# Patient Record
Sex: Female | Born: 1955
Health system: Southern US, Community
[De-identification: ages and names within clinical notes are randomized; demographics above are authoritative.]

---

## 2011-06-28 DIAGNOSIS — J309 Allergic rhinitis, unspecified: Secondary | ICD-10-CM | POA: Diagnosis not present

## 2011-06-28 DIAGNOSIS — K219 Gastro-esophageal reflux disease without esophagitis: Secondary | ICD-10-CM | POA: Diagnosis not present

## 2011-09-19 DIAGNOSIS — J309 Allergic rhinitis, unspecified: Secondary | ICD-10-CM | POA: Diagnosis not present

## 2011-09-19 DIAGNOSIS — E7800 Pure hypercholesterolemia, unspecified: Secondary | ICD-10-CM | POA: Diagnosis not present

## 2011-09-19 DIAGNOSIS — Z79899 Other long term (current) drug therapy: Secondary | ICD-10-CM | POA: Diagnosis not present

## 2011-09-19 DIAGNOSIS — I10 Essential (primary) hypertension: Secondary | ICD-10-CM | POA: Diagnosis not present

## 2011-09-27 DIAGNOSIS — E7800 Pure hypercholesterolemia, unspecified: Secondary | ICD-10-CM | POA: Diagnosis not present

## 2011-09-27 DIAGNOSIS — K219 Gastro-esophageal reflux disease without esophagitis: Secondary | ICD-10-CM | POA: Diagnosis not present

## 2011-09-27 DIAGNOSIS — J309 Allergic rhinitis, unspecified: Secondary | ICD-10-CM | POA: Diagnosis not present

## 2012-01-23 DIAGNOSIS — Z1231 Encounter for screening mammogram for malignant neoplasm of breast: Secondary | ICD-10-CM | POA: Diagnosis not present

## 2012-03-13 DIAGNOSIS — Z23 Encounter for immunization: Secondary | ICD-10-CM | POA: Diagnosis not present

## 2012-03-13 DIAGNOSIS — K219 Gastro-esophageal reflux disease without esophagitis: Secondary | ICD-10-CM | POA: Diagnosis not present

## 2012-03-13 DIAGNOSIS — M722 Plantar fascial fibromatosis: Secondary | ICD-10-CM | POA: Diagnosis not present

## 2012-04-16 DIAGNOSIS — E7800 Pure hypercholesterolemia, unspecified: Secondary | ICD-10-CM | POA: Diagnosis not present

## 2012-04-16 DIAGNOSIS — R0602 Shortness of breath: Secondary | ICD-10-CM | POA: Diagnosis not present

## 2012-04-16 DIAGNOSIS — Z0000 Encounter for general adult medical examination without abnormal findings: Secondary | ICD-10-CM | POA: Diagnosis not present

## 2012-05-07 DIAGNOSIS — Z79899 Other long term (current) drug therapy: Secondary | ICD-10-CM | POA: Diagnosis not present

## 2012-05-07 DIAGNOSIS — Z780 Asymptomatic menopausal state: Secondary | ICD-10-CM | POA: Diagnosis not present

## 2012-05-07 DIAGNOSIS — E7800 Pure hypercholesterolemia, unspecified: Secondary | ICD-10-CM | POA: Diagnosis not present

## 2012-11-10 DIAGNOSIS — J309 Allergic rhinitis, unspecified: Secondary | ICD-10-CM | POA: Diagnosis not present

## 2012-11-10 DIAGNOSIS — G5620 Lesion of ulnar nerve, unspecified upper limb: Secondary | ICD-10-CM | POA: Diagnosis not present

## 2012-11-10 DIAGNOSIS — G5600 Carpal tunnel syndrome, unspecified upper limb: Secondary | ICD-10-CM | POA: Diagnosis not present

## 2012-11-10 DIAGNOSIS — K219 Gastro-esophageal reflux disease without esophagitis: Secondary | ICD-10-CM | POA: Diagnosis not present

## 2013-06-17 ENCOUNTER — Other Ambulatory Visit: Payer: Self-pay | Admitting: Internal Medicine

## 2013-06-17 DIAGNOSIS — K219 Gastro-esophageal reflux disease without esophagitis: Secondary | ICD-10-CM | POA: Diagnosis not present

## 2013-06-17 DIAGNOSIS — G589 Mononeuropathy, unspecified: Secondary | ICD-10-CM | POA: Diagnosis not present

## 2013-06-17 DIAGNOSIS — Z136 Encounter for screening for cardiovascular disorders: Secondary | ICD-10-CM | POA: Diagnosis not present

## 2013-06-17 DIAGNOSIS — Z1231 Encounter for screening mammogram for malignant neoplasm of breast: Secondary | ICD-10-CM

## 2013-06-17 DIAGNOSIS — Z79899 Other long term (current) drug therapy: Secondary | ICD-10-CM | POA: Diagnosis not present

## 2013-06-17 DIAGNOSIS — Z1331 Encounter for screening for depression: Secondary | ICD-10-CM | POA: Diagnosis not present

## 2013-06-17 DIAGNOSIS — Z0000 Encounter for general adult medical examination without abnormal findings: Secondary | ICD-10-CM | POA: Diagnosis not present

## 2013-06-17 DIAGNOSIS — Z23 Encounter for immunization: Secondary | ICD-10-CM | POA: Diagnosis not present

## 2013-07-01 ENCOUNTER — Ambulatory Visit
Admission: RE | Admit: 2013-07-01 | Discharge: 2013-07-01 | Disposition: A | Discharge status: Home / Self Care | Payer: Medicare Other | Source: Ambulatory Visit | Attending: Internal Medicine | Admitting: Internal Medicine

## 2013-07-01 DIAGNOSIS — Z1231 Encounter for screening mammogram for malignant neoplasm of breast: Secondary | ICD-10-CM

## 2013-07-08 ENCOUNTER — Other Ambulatory Visit: Payer: Self-pay | Admitting: Internal Medicine

## 2013-07-08 DIAGNOSIS — R928 Other abnormal and inconclusive findings on diagnostic imaging of breast: Secondary | ICD-10-CM

## 2013-07-31 DIAGNOSIS — N6459 Other signs and symptoms in breast: Secondary | ICD-10-CM | POA: Diagnosis not present

## 2013-08-14 DIAGNOSIS — R922 Inconclusive mammogram: Secondary | ICD-10-CM | POA: Diagnosis not present

## 2013-08-14 DIAGNOSIS — N6009 Solitary cyst of unspecified breast: Secondary | ICD-10-CM | POA: Diagnosis not present

## 2013-08-29 DIAGNOSIS — N760 Acute vaginitis: Secondary | ICD-10-CM | POA: Diagnosis not present

## 2013-08-29 DIAGNOSIS — N94819 Vulvodynia, unspecified: Secondary | ICD-10-CM | POA: Diagnosis not present

## 2014-03-07 DIAGNOSIS — Z23 Encounter for immunization: Secondary | ICD-10-CM | POA: Diagnosis not present

## 2014-06-21 DIAGNOSIS — Z0000 Encounter for general adult medical examination without abnormal findings: Secondary | ICD-10-CM | POA: Diagnosis not present

## 2014-06-21 DIAGNOSIS — K219 Gastro-esophageal reflux disease without esophagitis: Secondary | ICD-10-CM | POA: Diagnosis not present

## 2014-06-21 DIAGNOSIS — Z1389 Encounter for screening for other disorder: Secondary | ICD-10-CM | POA: Diagnosis not present

## 2014-06-30 DIAGNOSIS — Z23 Encounter for immunization: Secondary | ICD-10-CM | POA: Diagnosis not present

## 2014-07-27 ENCOUNTER — Other Ambulatory Visit: Payer: Self-pay | Admitting: Obstetrics and Gynecology

## 2014-07-27 ENCOUNTER — Other Ambulatory Visit (HOSPITAL_COMMUNITY)
Admission: RE | Admit: 2014-07-27 | Discharge: 2014-07-27 | Disposition: A | Discharge status: Home / Self Care | Payer: Medicare Other | Source: Ambulatory Visit | Attending: Obstetrics and Gynecology | Admitting: Obstetrics and Gynecology

## 2014-07-27 DIAGNOSIS — Z1151 Encounter for screening for human papillomavirus (HPV): Secondary | ICD-10-CM | POA: Insufficient documentation

## 2014-07-27 DIAGNOSIS — Z01419 Encounter for gynecological examination (general) (routine) without abnormal findings: Secondary | ICD-10-CM | POA: Diagnosis not present

## 2014-07-27 DIAGNOSIS — Z124 Encounter for screening for malignant neoplasm of cervix: Secondary | ICD-10-CM | POA: Insufficient documentation

## 2014-07-28 LAB — CYTOLOGY - PAP

## 2014-08-09 DIAGNOSIS — Z1231 Encounter for screening mammogram for malignant neoplasm of breast: Secondary | ICD-10-CM | POA: Diagnosis not present

## 2014-08-10 DIAGNOSIS — R197 Diarrhea, unspecified: Secondary | ICD-10-CM | POA: Diagnosis not present

## 2014-08-10 DIAGNOSIS — D649 Anemia, unspecified: Secondary | ICD-10-CM | POA: Diagnosis not present

## 2014-08-10 DIAGNOSIS — R1013 Epigastric pain: Secondary | ICD-10-CM | POA: Diagnosis not present

## 2015-06-21 DIAGNOSIS — K219 Gastro-esophageal reflux disease without esophagitis: Secondary | ICD-10-CM | POA: Diagnosis not present

## 2015-06-21 DIAGNOSIS — R21 Rash and other nonspecific skin eruption: Secondary | ICD-10-CM | POA: Diagnosis not present

## 2015-08-15 DIAGNOSIS — Z1231 Encounter for screening mammogram for malignant neoplasm of breast: Secondary | ICD-10-CM | POA: Diagnosis not present

## 2015-08-22 DIAGNOSIS — Z7251 High risk heterosexual behavior: Secondary | ICD-10-CM | POA: Diagnosis not present

## 2015-08-23 DIAGNOSIS — Z136 Encounter for screening for cardiovascular disorders: Secondary | ICD-10-CM | POA: Diagnosis not present

## 2015-08-23 DIAGNOSIS — Z131 Encounter for screening for diabetes mellitus: Secondary | ICD-10-CM | POA: Diagnosis not present

## 2016-07-27 DIAGNOSIS — K219 Gastro-esophageal reflux disease without esophagitis: Secondary | ICD-10-CM | POA: Diagnosis not present

## 2016-07-27 DIAGNOSIS — Z1389 Encounter for screening for other disorder: Secondary | ICD-10-CM | POA: Diagnosis not present

## 2016-07-27 DIAGNOSIS — Z0000 Encounter for general adult medical examination without abnormal findings: Secondary | ICD-10-CM | POA: Diagnosis not present

## 2016-07-27 DIAGNOSIS — Z131 Encounter for screening for diabetes mellitus: Secondary | ICD-10-CM | POA: Diagnosis not present

## 2016-07-27 DIAGNOSIS — Z79899 Other long term (current) drug therapy: Secondary | ICD-10-CM | POA: Diagnosis not present

## 2016-07-27 DIAGNOSIS — J3089 Other allergic rhinitis: Secondary | ICD-10-CM | POA: Diagnosis not present

## 2016-07-27 DIAGNOSIS — J4520 Mild intermittent asthma, uncomplicated: Secondary | ICD-10-CM | POA: Diagnosis not present

## 2016-07-27 DIAGNOSIS — Z5181 Encounter for therapeutic drug level monitoring: Secondary | ICD-10-CM | POA: Diagnosis not present

## 2016-08-14 DIAGNOSIS — Z1231 Encounter for screening mammogram for malignant neoplasm of breast: Secondary | ICD-10-CM | POA: Diagnosis not present

## 2016-11-30 ENCOUNTER — Other Ambulatory Visit: Payer: Self-pay | Admitting: Internal Medicine

## 2016-11-30 ENCOUNTER — Ambulatory Visit
Admission: RE | Admit: 2016-11-30 | Discharge: 2016-11-30 | Disposition: A | Discharge status: Home / Self Care | Payer: Medicare Other | Source: Ambulatory Visit | Attending: Internal Medicine | Admitting: Internal Medicine

## 2016-11-30 DIAGNOSIS — M79671 Pain in right foot: Secondary | ICD-10-CM

## 2016-11-30 DIAGNOSIS — J4520 Mild intermittent asthma, uncomplicated: Secondary | ICD-10-CM | POA: Diagnosis not present

## 2016-11-30 DIAGNOSIS — M19071 Primary osteoarthritis, right ankle and foot: Secondary | ICD-10-CM | POA: Diagnosis not present

## 2017-07-29 DIAGNOSIS — J3089 Other allergic rhinitis: Secondary | ICD-10-CM | POA: Diagnosis not present

## 2017-07-29 DIAGNOSIS — Z136 Encounter for screening for cardiovascular disorders: Secondary | ICD-10-CM | POA: Diagnosis not present

## 2017-07-29 DIAGNOSIS — Z0000 Encounter for general adult medical examination without abnormal findings: Secondary | ICD-10-CM | POA: Diagnosis not present

## 2017-07-29 DIAGNOSIS — K219 Gastro-esophageal reflux disease without esophagitis: Secondary | ICD-10-CM | POA: Diagnosis not present

## 2017-07-29 DIAGNOSIS — Z1389 Encounter for screening for other disorder: Secondary | ICD-10-CM | POA: Diagnosis not present

## 2017-07-29 DIAGNOSIS — J4520 Mild intermittent asthma, uncomplicated: Secondary | ICD-10-CM | POA: Diagnosis not present

## 2017-08-13 DIAGNOSIS — Z1211 Encounter for screening for malignant neoplasm of colon: Secondary | ICD-10-CM | POA: Diagnosis not present

## 2017-08-13 DIAGNOSIS — K219 Gastro-esophageal reflux disease without esophagitis: Secondary | ICD-10-CM | POA: Diagnosis not present

## 2017-08-15 DIAGNOSIS — Z1231 Encounter for screening mammogram for malignant neoplasm of breast: Secondary | ICD-10-CM | POA: Diagnosis not present

## 2017-08-29 ENCOUNTER — Other Ambulatory Visit (HOSPITAL_COMMUNITY)
Admission: RE | Admit: 2017-08-29 | Discharge: 2017-08-29 | Disposition: A | Discharge status: Home / Self Care | Payer: Medicare Other | Source: Ambulatory Visit | Attending: Nurse Practitioner | Admitting: Nurse Practitioner

## 2017-08-29 ENCOUNTER — Other Ambulatory Visit: Payer: Self-pay | Admitting: Nurse Practitioner

## 2017-08-29 DIAGNOSIS — Z01419 Encounter for gynecological examination (general) (routine) without abnormal findings: Secondary | ICD-10-CM | POA: Diagnosis not present

## 2017-08-29 DIAGNOSIS — R3915 Urgency of urination: Secondary | ICD-10-CM | POA: Diagnosis not present

## 2017-09-04 LAB — CYTOLOGY - PAP
Diagnosis: NEGATIVE
HPV (WINDOPATH): NOT DETECTED

## 2017-09-13 DIAGNOSIS — K3189 Other diseases of stomach and duodenum: Secondary | ICD-10-CM | POA: Diagnosis not present

## 2017-09-13 DIAGNOSIS — Z1211 Encounter for screening for malignant neoplasm of colon: Secondary | ICD-10-CM | POA: Diagnosis not present

## 2017-09-13 DIAGNOSIS — K228 Other specified diseases of esophagus: Secondary | ICD-10-CM | POA: Diagnosis not present

## 2017-09-13 DIAGNOSIS — K625 Hemorrhage of anus and rectum: Secondary | ICD-10-CM | POA: Diagnosis not present

## 2017-09-13 DIAGNOSIS — K635 Polyp of colon: Secondary | ICD-10-CM | POA: Diagnosis not present

## 2017-09-13 DIAGNOSIS — K317 Polyp of stomach and duodenum: Secondary | ICD-10-CM | POA: Diagnosis not present

## 2017-09-13 DIAGNOSIS — K2930 Chronic superficial gastritis without bleeding: Secondary | ICD-10-CM | POA: Diagnosis not present

## 2017-09-18 DIAGNOSIS — K317 Polyp of stomach and duodenum: Secondary | ICD-10-CM | POA: Diagnosis not present

## 2017-09-18 DIAGNOSIS — K3189 Other diseases of stomach and duodenum: Secondary | ICD-10-CM | POA: Diagnosis not present

## 2017-09-18 DIAGNOSIS — K635 Polyp of colon: Secondary | ICD-10-CM | POA: Diagnosis not present

## 2017-09-18 DIAGNOSIS — Z1211 Encounter for screening for malignant neoplasm of colon: Secondary | ICD-10-CM | POA: Diagnosis not present

## 2018-07-31 DIAGNOSIS — R030 Elevated blood-pressure reading, without diagnosis of hypertension: Secondary | ICD-10-CM | POA: Diagnosis not present

## 2018-07-31 DIAGNOSIS — M19071 Primary osteoarthritis, right ankle and foot: Secondary | ICD-10-CM | POA: Diagnosis not present

## 2018-07-31 DIAGNOSIS — Z0000 Encounter for general adult medical examination without abnormal findings: Secondary | ICD-10-CM | POA: Diagnosis not present

## 2018-07-31 DIAGNOSIS — K219 Gastro-esophageal reflux disease without esophagitis: Secondary | ICD-10-CM | POA: Diagnosis not present

## 2018-07-31 DIAGNOSIS — Z1389 Encounter for screening for other disorder: Secondary | ICD-10-CM | POA: Diagnosis not present

## 2018-12-29 DIAGNOSIS — Z1231 Encounter for screening mammogram for malignant neoplasm of breast: Secondary | ICD-10-CM | POA: Diagnosis not present

## 2018-12-29 DIAGNOSIS — Z803 Family history of malignant neoplasm of breast: Secondary | ICD-10-CM | POA: Diagnosis not present

## 2019-01-07 DIAGNOSIS — Z23 Encounter for immunization: Secondary | ICD-10-CM | POA: Diagnosis not present

## 2019-05-01 ENCOUNTER — Ambulatory Visit: Payer: Medicare Other

## 2019-08-04 DIAGNOSIS — M25552 Pain in left hip: Secondary | ICD-10-CM | POA: Diagnosis not present

## 2019-08-04 DIAGNOSIS — Z1389 Encounter for screening for other disorder: Secondary | ICD-10-CM | POA: Diagnosis not present

## 2019-08-04 DIAGNOSIS — Z23 Encounter for immunization: Secondary | ICD-10-CM | POA: Diagnosis not present

## 2019-08-04 DIAGNOSIS — Z0000 Encounter for general adult medical examination without abnormal findings: Secondary | ICD-10-CM | POA: Diagnosis not present

## 2019-08-04 DIAGNOSIS — K219 Gastro-esophageal reflux disease without esophagitis: Secondary | ICD-10-CM | POA: Diagnosis not present

## 2019-08-04 DIAGNOSIS — I10 Essential (primary) hypertension: Secondary | ICD-10-CM | POA: Diagnosis not present

## 2019-09-18 DIAGNOSIS — I10 Essential (primary) hypertension: Secondary | ICD-10-CM | POA: Diagnosis not present

## 2019-10-30 DIAGNOSIS — I10 Essential (primary) hypertension: Secondary | ICD-10-CM | POA: Diagnosis not present

## 2020-01-04 DIAGNOSIS — Z1231 Encounter for screening mammogram for malignant neoplasm of breast: Secondary | ICD-10-CM | POA: Diagnosis not present

## 2020-01-04 DIAGNOSIS — Z13820 Encounter for screening for osteoporosis: Secondary | ICD-10-CM | POA: Diagnosis not present

## 2020-01-04 DIAGNOSIS — Z01419 Encounter for gynecological examination (general) (routine) without abnormal findings: Secondary | ICD-10-CM | POA: Diagnosis not present

## 2020-01-04 DIAGNOSIS — Z87898 Personal history of other specified conditions: Secondary | ICD-10-CM | POA: Diagnosis not present

## 2020-01-08 DIAGNOSIS — Z23 Encounter for immunization: Secondary | ICD-10-CM | POA: Diagnosis not present

## 2020-01-13 DIAGNOSIS — Z23 Encounter for immunization: Secondary | ICD-10-CM | POA: Diagnosis not present

## 2020-01-20 DIAGNOSIS — Z87898 Personal history of other specified conditions: Secondary | ICD-10-CM | POA: Diagnosis not present

## 2020-01-26 ENCOUNTER — Other Ambulatory Visit: Payer: Self-pay | Admitting: Nurse Practitioner

## 2020-01-26 DIAGNOSIS — Z13820 Encounter for screening for osteoporosis: Secondary | ICD-10-CM

## 2020-01-29 ENCOUNTER — Ambulatory Visit
Admission: RE | Admit: 2020-01-29 | Discharge: 2020-01-29 | Disposition: A | Discharge status: Home / Self Care | Payer: Medicare Other | Source: Ambulatory Visit | Attending: Nurse Practitioner | Admitting: Nurse Practitioner

## 2020-01-29 ENCOUNTER — Other Ambulatory Visit: Payer: Self-pay

## 2020-01-29 DIAGNOSIS — Z13820 Encounter for screening for osteoporosis: Secondary | ICD-10-CM

## 2020-01-29 DIAGNOSIS — Z780 Asymptomatic menopausal state: Secondary | ICD-10-CM | POA: Diagnosis not present

## 2020-02-03 DIAGNOSIS — R3121 Asymptomatic microscopic hematuria: Secondary | ICD-10-CM | POA: Diagnosis not present

## 2020-02-03 DIAGNOSIS — Z87898 Personal history of other specified conditions: Secondary | ICD-10-CM | POA: Diagnosis not present

## 2020-07-14 DIAGNOSIS — Z23 Encounter for immunization: Secondary | ICD-10-CM | POA: Diagnosis not present

## 2020-08-22 DIAGNOSIS — K219 Gastro-esophageal reflux disease without esophagitis: Secondary | ICD-10-CM | POA: Diagnosis not present

## 2020-08-22 DIAGNOSIS — Z0000 Encounter for general adult medical examination without abnormal findings: Secondary | ICD-10-CM | POA: Diagnosis not present

## 2020-08-22 DIAGNOSIS — I10 Essential (primary) hypertension: Secondary | ICD-10-CM | POA: Diagnosis not present

## 2020-08-22 DIAGNOSIS — Z1389 Encounter for screening for other disorder: Secondary | ICD-10-CM | POA: Diagnosis not present

## 2020-09-20 DIAGNOSIS — H43813 Vitreous degeneration, bilateral: Secondary | ICD-10-CM | POA: Diagnosis not present

## 2020-09-20 DIAGNOSIS — H52223 Regular astigmatism, bilateral: Secondary | ICD-10-CM | POA: Diagnosis not present

## 2020-09-20 DIAGNOSIS — H524 Presbyopia: Secondary | ICD-10-CM | POA: Diagnosis not present

## 2020-09-20 DIAGNOSIS — H5213 Myopia, bilateral: Secondary | ICD-10-CM | POA: Diagnosis not present

## 2020-09-20 DIAGNOSIS — H2513 Age-related nuclear cataract, bilateral: Secondary | ICD-10-CM | POA: Diagnosis not present

## 2020-09-20 DIAGNOSIS — H16223 Keratoconjunctivitis sicca, not specified as Sjogren's, bilateral: Secondary | ICD-10-CM | POA: Diagnosis not present

## 2021-01-17 DIAGNOSIS — Z803 Family history of malignant neoplasm of breast: Secondary | ICD-10-CM | POA: Diagnosis not present

## 2021-01-17 DIAGNOSIS — Z23 Encounter for immunization: Secondary | ICD-10-CM | POA: Diagnosis not present

## 2021-01-17 DIAGNOSIS — Z9189 Other specified personal risk factors, not elsewhere classified: Secondary | ICD-10-CM | POA: Diagnosis not present

## 2021-01-26 DIAGNOSIS — Z1231 Encounter for screening mammogram for malignant neoplasm of breast: Secondary | ICD-10-CM | POA: Diagnosis not present

## 2021-03-31 DIAGNOSIS — U071 COVID-19: Secondary | ICD-10-CM | POA: Diagnosis not present

## 2021-08-29 ENCOUNTER — Other Ambulatory Visit: Payer: Self-pay | Admitting: Internal Medicine

## 2021-08-29 ENCOUNTER — Other Ambulatory Visit: Payer: Self-pay

## 2021-08-29 DIAGNOSIS — H539 Unspecified visual disturbance: Secondary | ICD-10-CM | POA: Diagnosis not present

## 2021-08-29 DIAGNOSIS — I10 Essential (primary) hypertension: Secondary | ICD-10-CM | POA: Diagnosis not present

## 2021-08-29 DIAGNOSIS — D649 Anemia, unspecified: Secondary | ICD-10-CM | POA: Diagnosis not present

## 2021-08-29 DIAGNOSIS — Z13220 Encounter for screening for lipoid disorders: Secondary | ICD-10-CM | POA: Diagnosis not present

## 2021-08-29 DIAGNOSIS — Z5181 Encounter for therapeutic drug level monitoring: Secondary | ICD-10-CM | POA: Diagnosis not present

## 2021-08-29 DIAGNOSIS — Z0000 Encounter for general adult medical examination without abnormal findings: Secondary | ICD-10-CM | POA: Diagnosis not present

## 2021-08-29 DIAGNOSIS — Z1331 Encounter for screening for depression: Secondary | ICD-10-CM | POA: Diagnosis not present

## 2021-08-29 DIAGNOSIS — Z23 Encounter for immunization: Secondary | ICD-10-CM | POA: Diagnosis not present

## 2021-08-29 DIAGNOSIS — K219 Gastro-esophageal reflux disease without esophagitis: Secondary | ICD-10-CM | POA: Diagnosis not present

## 2021-08-29 DIAGNOSIS — M79605 Pain in left leg: Secondary | ICD-10-CM | POA: Diagnosis not present

## 2021-09-08 ENCOUNTER — Ambulatory Visit
Admission: RE | Admit: 2021-09-08 | Discharge: 2021-09-08 | Disposition: A | Discharge status: Home / Self Care | Payer: Medicare Other | Source: Ambulatory Visit | Attending: Internal Medicine | Admitting: Internal Medicine

## 2021-09-08 DIAGNOSIS — I10 Essential (primary) hypertension: Secondary | ICD-10-CM | POA: Diagnosis not present

## 2021-09-12 NOTE — Therapy (Signed)
OUTPATIENT PHYSICAL THERAPY LOWER EXTREMITY EVALUATION   Patient Name: Rose Burke MRN: [id number] DOB:1955-04-15, 66 y.o., female Today's Date: 09/14/2021   PT End of Session - 09/14/21 0549     Visit Number 1    Number of Visits 13    Date for PT Re-Evaluation 11/03/21    Authorization Type MEDICARE PART A AND B; BCBS SUPPLEMENT    PT Start Time 0933    PT Stop Time 1019    PT Time Calculation (min) 46 min    Activity Tolerance Patient tolerated treatment well    Behavior During Therapy [facility] for tasks assessed/performed             History reviewed. No pertinent past medical history. History reviewed. No pertinent surgical history. There are no problems to display for this patient.   PCP: Kirby Funk, MD  REFERRING PROVIDER: Kirby Funk, MD  REFERRING DIAG: [phone] (ICD-10-CM) - Pain in left leg  THERAPY DIAG:  Pain in left leg  Muscle weakness (generalized)  Abnormal posture  Rationale for Evaluation and Treatment Rehabilitation  ONSET DATE:4-5 months  SUBJECTIVE:   SUBJECTIVE STATEMENT: Pt reports a probable MOI being a  fall in the snow in 2015. Pain in her legs have gradually become more of an issue. Pt reports a specific radiating L lateral leg pain to the lateral foot with an electrical feeling starting arounf 4-32months ago. She notes it was occurring at a frequency of 2x/week, but it has not occurred recently for approx. 2 weeks. Pt denies pain associated with back movements. Has arthritic pain of the knees  PERTINENT HISTORY: High BMI  PAIN:  Are you having pain? Yes: NPRS scale: 7/10 Pain location: L lalteral leg extending to the lateral foot Pain description: ache, sharp, throb, electric Aggravating factors: Cumulative activity, not a specific movement Relieving factors: Tylenol, walking Pain 3-10/10  PRECAUTIONS: None  WEIGHT BEARING RESTRICTIONS No  FALLS:  Has patient fallen in last 6 months? No  LIVING ENVIRONMENT: Lives  with: lives with their family Lives in: House/apartment No issue with accessing or mobility within home   OCCUPATION: retired  PLOF: Independent  PATIENT GOALS: Relief of pain. Find out what's going on with my body.   OBJECTIVE:   DIAGNOSTIC FINDINGS: NA  PATIENT SURVEYS:  Modified Oswestry To be assessed   COGNITION:  Overall cognitive status: Within functional limits for tasks assessed     SENSATION: WFL  MUSCLE LENGTH: Hamstrings: min tight L and R Thomas test: TBA  POSTURE: marked increased lumbar lordosis  PALPATION: TPP B lateral hips, L>R and to the L superior to the lateral iliac creast  Trunk AROMs:  WNLs for all movements and pt's pain was not reproduced. With forward flexion, pt's lordotic curve did not fully decrease  LOWER EXTREMITY ROM:   Grossly WNLs. AROM of the L hip for all movements did not reproduce pt's pain.  LOWER EXTREMITY MMT:   Grossly 4+ for legs. Weak core.  Lumabr SPECIAL TESTS:  Slump test and SLR were both negative  FUNCTIONAL TESTS:  5 times sit to stand: 10.3 sec s use of hands . WNLs  GAIT: Distance walked: 2107ft Assistive device utilized: None Level of assistance: Complete Independence Comments: WNLs  TODAY'S TREATMENT: - Supine Posterior Pelvic Tilt hands sliding up thighs 10 reps - 3 hold - Seated Flexion Stretch 2 chair forward and laterally 4 reps  20 hold  PATIENT EDUCATION:  Education details: Eval findings, POC, HEP Person educated: Patient Education method: Explanation,  Demonstration, Tactile cues, Verbal cues, and Handouts Education comprehension: verbalized understanding, returned demonstration, verbal cues required, and tactile cues required   HOME EXERCISE PROGRAM: Access Code: [email or web address] URL: [email or web address] Date: 09/14/2021 Prepared by: Joellyn Rued  Exercises - Supine Posterior Pelvic Tilt  - 2 x daily - 7 x weekly - 1 sets - 10 reps - 3 hold - Seated Flexion Stretch with Swiss  Ball  - 2-4 x daily - 7 x weekly - 1 sets - 2 reps - 20 hold  ASSESSMENT:  CLINICAL IMPRESSION: Patient is a 66 y.o. F who was seen today for physical therapy evaluation and treatment for L leg pain. Per pt's report of symptoms, pt L leg pain seems for likely to be associated with low back. Pt has not experienced the L lateral leg pain in approx 2 weeks and it was not reproduced today. Pt doe have an increased lordosis curve which is not fully decreased with forward flexion. Pt has additional leg pain, particularly bilat knee pain, appearing related to activity level.  OBJECTIVE IMPAIRMENTS decreased strength, impaired flexibility, postural dysfunction, obesity, and pain.   ACTIVITY LIMITATIONS carrying, lifting, bending, and squatting  PARTICIPATION LIMITATIONS: yard work  PERSONAL FACTORS Past/current experiences, Time since onset of injury/illness/exacerbation, and 1 comorbidity: high  BMI  are also affecting patient's functional outcome.   REHAB POTENTIAL: Good  CLINICAL DECISION MAKING: Stable/uncomplicated  EVALUATION COMPLEXITY: Low   GOALS:  SHORT TERM GOALS: = LTGs    LONG TERM GOALS: Target date: 11/03/21   PT will be ind in a final HEP to maintain achieved LOF Baseline: started on eval Goal status: INITIAL  2.  Pt will report improved L lateral leg with an intensity of 3/10 or less and a decreased frequency of 1 incident per 3 weeks with daily activities  Baseline:7/10, 2x per week  Goal status: INITIAL  3.  Pt will demonstrate improved core strength by maintaining a plank on knee and standard bridge for 30 sec for improved function with less pain Baseline:  Goal status: INITIAL  4.  Pt will demonstrate improved hamstring and and hip flexor ROM for improved function with less pain Baseline:  Goal status: INITIAL  PLAN: PT FREQUENCY: 2x/week  PT DURATION: 6 weeks  PLANNED INTERVENTIONS: Therapeutic exercises, Therapeutic activity, Patient/Family education,  Joint mobilization, Aquatic Therapy, Dry Needling, Electrical stimulation, Spinal mobilization, Cryotherapy, Moist heat, Taping, Traction, Ultrasound, Ionotophoresis 4mg /ml Dexamethasone, Manual therapy, and Re-evaluation  PLAN FOR NEXT SESSION: Assess response to HEP; complete the oswestry; progress therx as indicated; use of modalities, manula therapy and TPDN as indicated   MS, PT 09/14/21 11:20 AM

## 2021-09-13 ENCOUNTER — Ambulatory Visit: Payer: Medicare Other | Attending: Internal Medicine

## 2021-09-13 ENCOUNTER — Other Ambulatory Visit: Payer: Self-pay

## 2021-09-13 DIAGNOSIS — M79605 Pain in left leg: Secondary | ICD-10-CM | POA: Diagnosis not present

## 2021-09-13 DIAGNOSIS — M6281 Muscle weakness (generalized): Secondary | ICD-10-CM | POA: Insufficient documentation

## 2021-09-13 DIAGNOSIS — R293 Abnormal posture: Secondary | ICD-10-CM | POA: Insufficient documentation

## 2021-09-14 ENCOUNTER — Other Ambulatory Visit: Payer: Self-pay

## 2021-09-15 DIAGNOSIS — D649 Anemia, unspecified: Secondary | ICD-10-CM | POA: Diagnosis not present

## 2021-09-20 NOTE — Therapy (Signed)
OUTPATIENT PHYSICAL THERAPY TREATMENT NOTE   Patient Name: Rose Burke MRN: [id number] DOB:12/18/1955, 66 y.o., female Today's Date: 09/21/2021  PCP: Lavone Orn, MD REFERRING PROVIDER: Lavone Orn, MD  END OF SESSION:   PT End of Session - 09/21/21 1145     Visit Number 2    Number of Visits 13    Date for PT Re-Evaluation 11/03/21    Authorization Type MEDICARE PART A AND B; BCBS SUPPLEMENT    PT Start Time 1146    PT Stop Time 1232    PT Time Calculation (min) 46 min    Activity Tolerance Patient tolerated treatment well    Behavior During Therapy [facility] for tasks assessed/performed             History reviewed. No pertinent past medical history. History reviewed. No pertinent surgical history. There are no problems to display for this patient.   REFERRING DIAG: M79.605 (ICD-10-CM) - Pain in left leg  THERAPY DIAG:  Pain in left leg  Muscle weakness (generalized)  Abnormal posture  Rationale for Evaluation and Treatment Rehabilitation   ONSET DATE:4-5 months   SUBJECTIVE:    SUBJECTIVE STATEMENT: Pt reports she has not experienced the sharp L lateral leg pain for which she was referred. Pt notes some soreness along her L leg.  Pt reports her knees bother her especially after prolonged sitting.  PAIN:  Are you having pain? Yes: NPRS scale: 0/10 Pain location: L lalteral leg extending to the lateral foot Pain description: ache, sharp, throb, electric Aggravating factors: Cumulative activity, not a specific movement Relieving factors: Tylenol, walking Pain 3-10/10 Bilat knee 3/10, wrose 9/10 steps  PERTINENT HISTORY: High BMI   PRECAUTIONS: None   PATIENT GOALS: Relief of pain. Find out what's going on with my body.     OBJECTIVE: (objective measures completed at initial evaluation unless otherwise dated)    DIAGNOSTIC FINDINGS: NA   PATIENT SURVEYS:  Modified Oswestry To be assessed    COGNITION:           Overall cognitive status:  Within functional limits for tasks assessed                          SENSATION: WFL   MUSCLE LENGTH: Hamstrings: min tight L and R Thomas test: TBA   POSTURE: marked increased lumbar lordosis   PALPATION: TPP B lateral hips, L>R and to the L superior to the lateral iliac creast   Trunk AROMs:            WNLs for all movements and pt's pain was not reproduced. With forward flexion, pt's lordotic curve did not fully decrease   LOWER EXTREMITY ROM:                       Grossly WNLs. AROM of the L hip for all movements did not reproduce pt's pain.   LOWER EXTREMITY MMT:                       Grossly 4+ for legs. Weak core.   Lumabr SPECIAL TESTS:  Slump test and SLR were both negative   FUNCTIONAL TESTS:  5 times sit to stand: 10.3 sec s use of hands . WNLs   GAIT: Distance walked: 258ft Assistive device utilized: None Level of assistance: Complete Independence Comments: WNLs   TODAY'S TREATMENT: [facility] Adult PT Treatment:  DATE: 09/21/21 Therapeutic Exercise: PPT x10 3" Abdominal bracing 90d x5 10' SLR c quad set x10 3" Bridging x10 3' Hip abd GTB x10 3" Hip add sets ball squeeze x10 3" Seated trunk flexion and side bending x2 20" each Sit to/from standing s hands x10 Self Care: Recommended pt completing marching and LAQs several reps to warm up knees knees prior to standing to minimize pain with this activity after prolonged sitting  Eval Treatment: - Supine Posterior Pelvic Tilt hands sliding up thighs 10 reps - 3 hold - Seated Flexion Stretch 2 chair forward and laterally 4 reps  20 hold   PATIENT EDUCATION:  Education details: Eval findings, POC, HEP Person educated: Patient Education method: Explanation, Demonstration, Tactile cues, Verbal cues, and Handouts Education comprehension: verbalized understanding, returned demonstration, verbal cues required, and tactile cues required     HOME EXERCISE  PROGRAM: Access Code: [email or web address] URL: [email or web address] Date: 09/14/2021 Prepared by: Gar Ponto   Exercises - Supine Posterior Pelvic Tilt  - 2 x daily - 7 x weekly - 1 sets - 10 reps - 3 hold - Seated Flexion Stretch with Swiss Ball  - 2-4 x daily - 7 x weekly - 1 sets - 2 reps - 20 hold   ASSESSMENT:   CLINICAL IMPRESSION: PT was completed for lumbopelvic/LE flexibility and strengthening. Pt's HEP was updated. Pt completed the exs properly. Pt tolerated the session without adverse effects. Pt returns to PT tomorrow and wiil assess her response.   OBJECTIVE IMPAIRMENTS decreased strength, impaired flexibility, postural dysfunction, obesity, and pain.      GOALS:   SHORT TERM GOALS: = LTGs      LONG TERM GOALS: Target date: 11/03/21    PT will be ind in a final HEP to maintain achieved LOF Baseline: started on eval Goal status: INITIAL   2.  Pt will report improved L lateral leg with an intensity of 3/10 or less and a decreased frequency of 1 incident per 3 weeks with daily activities  Baseline:7/10, 2x per week  Goal status: INITIAL   3.  Pt will demonstrate improved core strength by maintaining a plank on knee and standard bridge for 30 sec for improved function with less pain Baseline:  Goal status: INITIAL   4.  Pt will demonstrate improved hamstring and and hip flexor ROM for improved function with less pain Baseline:  Goal status: INITIAL   PLAN: PT FREQUENCY: 2x/week   PT DURATION: 6 weeks   PLANNED INTERVENTIONS: Therapeutic exercises, Therapeutic activity, Patient/Family education, Joint mobilization, Aquatic Therapy, Dry Needling, Electrical stimulation, Spinal mobilization, Cryotherapy, Moist heat, Taping, Traction, Ultrasound, Ionotophoresis 4mg /ml Dexamethasone, Manual therapy, and Re-evaluation   PLAN FOR NEXT SESSION: Assess response to HEP; complete the oswestry; progress therx as indicated; use of modalities, manual therapy and TPDN as  indicated    [employer], PT 09/21/2021, 1:25 PM

## 2021-09-21 ENCOUNTER — Ambulatory Visit: Payer: Medicare Other

## 2021-09-21 DIAGNOSIS — M79605 Pain in left leg: Secondary | ICD-10-CM

## 2021-09-21 DIAGNOSIS — M6281 Muscle weakness (generalized): Secondary | ICD-10-CM | POA: Diagnosis not present

## 2021-09-21 DIAGNOSIS — R293 Abnormal posture: Secondary | ICD-10-CM | POA: Diagnosis not present

## 2021-09-22 ENCOUNTER — Ambulatory Visit: Payer: Medicare Other

## 2021-09-22 DIAGNOSIS — M6281 Muscle weakness (generalized): Secondary | ICD-10-CM

## 2021-09-22 DIAGNOSIS — M79605 Pain in left leg: Secondary | ICD-10-CM

## 2021-09-22 DIAGNOSIS — R293 Abnormal posture: Secondary | ICD-10-CM | POA: Diagnosis not present

## 2021-09-22 NOTE — Therapy (Signed)
OUTPATIENT PHYSICAL THERAPY TREATMENT NOTE   Patient Name: Rose Burke MRN: [id number] DOB:03/26/1956, 66 y.o., female Today's Date: 09/22/2021  PCP: Kirby Funk, MD REFERRING PROVIDER: Kirby Funk, MD  END OF SESSION:   PT End of Session - 09/22/21 0850     Visit Number 3    Number of Visits 13    Date for PT Re-Evaluation 11/03/21    Authorization Type MEDICARE PART A AND B; BCBS SUPPLEMENT    PT Start Time 0804    PT Stop Time 0845    PT Time Calculation (min) 41 min    Activity Tolerance Patient tolerated treatment well    Behavior During Therapy [facility] for tasks assessed/performed              History reviewed. No pertinent past medical history. History reviewed. No pertinent surgical history. There are no problems to display for this patient.   REFERRING DIAG: M79.605 (ICD-10-CM) - Pain in left leg  THERAPY DIAG:  Pain in left leg  Muscle weakness (generalized)  Abnormal posture  Rationale for Evaluation and Treatment Rehabilitation   ONSET DATE:4-5 months   SUBJECTIVE:    SUBJECTIVE STATEMENT: Pt reports some low back and B knee soreness. Pt denies L lateral leg sharp pain.  PAIN:  Are you having pain? Yes: NPRS scale: 0/10 Pain location: L lalteral leg extending to the lateral foot Pain description: ache, sharp, throb, electric Aggravating factors: Cumulative activity, not a specific movement Relieving factors: Tylenol, walking Pain 3-10/10 Bilat knee 3/10, wrose 9/10 steps  PERTINENT HISTORY: High BMI   PRECAUTIONS: None   PATIENT GOALS: Relief of pain. Find out what's going on with my body.     OBJECTIVE: (objective measures completed at initial evaluation unless otherwise dated)    DIAGNOSTIC FINDINGS: NA   PATIENT SURVEYS:  Modified Oswestry To be assessed    COGNITION:           Overall cognitive status: Within functional limits for tasks assessed                          SENSATION: WFL   MUSCLE  LENGTH: Hamstrings: min tight L and R Thomas test: TBA   POSTURE: marked increased lumbar lordosis   PALPATION: TPP B lateral hips, L>R and to the L superior to the lateral iliac creast   Trunk AROMs:            WNLs for all movements and pt's pain was not reproduced. With forward flexion, pt's lordotic curve did not fully decrease   LOWER EXTREMITY ROM:                       Grossly WNLs. AROM of the L hip for all movements did not reproduce pt's pain.   LOWER EXTREMITY MMT:                       Grossly 4+ for legs. Weak core.   Lumabr SPECIAL TESTS:  Slump test and SLR were both negative   FUNCTIONAL TESTS:  5 times sit to stand: 10.3 sec s use of hands . WNLs   GAIT: Distance walked: 276ft Assistive device utilized: None Level of assistance: Complete Independence Comments: WNLs   TODAY'S TREATMENT: [facility] Adult PT Treatment:  DATE: 09/22/21 Therapeutic Exercise: Seated hamstring stretch x2 20" Seated trunk flexion and lat flexion x2 each 15" Prone quad stretch x2 20" Piriformis stretch x2 20" PPT x10 3" Abdominal bracing 90d x5 10' SLR c quad set x10 3" Bridging x10 3' Hip abd GTB x10 3" Hip add sets ball squeeze x10 3"  [facility] Adult PT Treatment:                                                DATE: 09/21/21 Therapeutic Exercise: PPT x10 3" Abdominal bracing 90d x5 10' SLR c quad set x10 3" Bridging x10 3' Hip abd GTB x10 3" Hip add sets ball squeeze x10 3" Seated trunk flexion and side bending x2 20" each Sit to/from standing s hands x10 Self Care: Recommended pt completing marching and LAQs several reps to warm up knees knees prior to standing to minimize pain with this activity after prolonged sitting  Eval Treatment: - Supine Posterior Pelvic Tilt hands sliding up thighs 10 reps - 3 hold - Seated Flexion Stretch 2 chair forward and laterally 4 reps  20 hold   PATIENT EDUCATION:  Education details: Eval  findings, POC, HEP Person educated: Patient Education method: Explanation, Demonstration, Tactile cues, Verbal cues, and Handouts Education comprehension: verbalized understanding, returned demonstration, verbal cues required, and tactile cues required     HOME EXERCISE PROGRAM: Access Code: [email or web address] URL: [email or web address] Date: 09/22/2021 Prepared by: Joellyn Rued  Exercises - Seated Flexion Stretch with Swiss Ball  - 2-4 x daily - 7 x weekly - 1 sets - 2 reps - 20 hold - Seated Thoracic Flexion and Rotation with Swiss Ball  - 2-4 x daily - 7 x weekly - 1 sets - 2 reps - 20 hold - Supine 90/90 Abdominal Bracing  - 1-2 x daily - 7 x weekly - 1 sets - 5 reps - 10 hold - Supine Posterior Pelvic Tilt  - 1-2 x daily - 7 x weekly - 1 sets - 10 reps - 3 hold - Active Straight Leg Raise with Quad Set  - 1-2 x daily - 7 x weekly - 1 sets - 10 reps - 3 hold - Supine Bridge  - 1-2 x daily - 7 x weekly - 1 sets - 10 reps - 3 hold - Hooklying Clamshell with Resistance  - 1-2 x daily - 7 x weekly - 1 sets - 10 reps - 3 hold - Supine Hip Adduction Isometric with Ball  - 1-2 x daily - 7 x weekly - 1 sets - 10 reps - 3 hold - Sit to Stand Without Arm Support  - 1-2 x daily - 7 x weekly - 1 sets - 10 reps - 3 hold - Seated Hamstring Stretch  - 1-2 x daily - 7 x weekly - 1 sets - 3 reps - 20 hold - Prone Quadriceps Stretch with Strap  - 1-2 x daily - 7 x weekly - 1 sets - 3 reps - 20 hold - Supine Piriformis Stretch with Foot on Ground  - 1-2 x daily - 7 x weekly - 1 sets - 3 reps - 20 hold   ASSESSMENT:   CLINICAL IMPRESSION: PT was completed for lumbopelvic and LE flexibility and strengthening with greater emphasis on flexibility today. Pt is responding appropriately to PT at this time frame of care. A better understanding  of response will be more apparent over the next couple of weeks. Pt will continue to benefit from skilled PT.   OBJECTIVE IMPAIRMENTS decreased strength, impaired  flexibility, postural dysfunction, obesity, and pain.      GOALS:   SHORT TERM GOALS: = LTGs      LONG TERM GOALS: Target date: 11/03/21    PT will be ind in a final HEP to maintain achieved LOF Baseline: started on eval Goal status: INITIAL   2.  Pt will report improved L lateral leg with an intensity of 3/10 or less and a decreased frequency of 1 incident per 3 weeks with daily activities  Baseline:7/10, 2x per week  Goal status: INITIAL   3.  Pt will demonstrate improved core strength by maintaining a plank on knee and standard bridge for 30 sec for improved function with less pain Baseline:  Goal status: INITIAL   4.  Pt will demonstrate improved hamstring and and hip flexor ROM for improved function with less pain Baseline:  Goal status: INITIAL   PLAN: PT FREQUENCY: 2x/week   PT DURATION: 6 weeks   PLANNED INTERVENTIONS: Therapeutic exercises, Therapeutic activity, Patient/Family education, Joint mobilization, Aquatic Therapy, Dry Needling, Electrical stimulation, Spinal mobilization, Cryotherapy, Moist heat, Taping, Traction, Ultrasound, Ionotophoresis 4mg /ml Dexamethasone, Manual therapy, and Re-evaluation   PLAN FOR NEXT SESSION: Assess response to HEP; complete the oswestry; progress therx as indicated; use of modalities, manual therapy and TPDN as indicated    MS, PT 09/22/21 9:05 AM

## 2021-09-25 ENCOUNTER — Ambulatory Visit: Payer: Medicare Other | Admitting: Physical Therapy

## 2021-09-25 ENCOUNTER — Encounter: Payer: Self-pay | Admitting: Physical Therapy

## 2021-09-25 DIAGNOSIS — M6281 Muscle weakness (generalized): Secondary | ICD-10-CM | POA: Diagnosis not present

## 2021-09-25 DIAGNOSIS — M79605 Pain in left leg: Secondary | ICD-10-CM

## 2021-09-25 DIAGNOSIS — R293 Abnormal posture: Secondary | ICD-10-CM

## 2021-09-25 NOTE — Therapy (Signed)
OUTPATIENT PHYSICAL THERAPY TREATMENT NOTE   Patient Name: Rose Burke MRN: [id number] DOB:04/14/1955, 66 y.o., female Today's Date: 09/25/2021  PCP: Kirby Funk, MD REFERRING PROVIDER: Kirby Funk, MD  END OF SESSION:   PT End of Session - 09/25/21 1318     Visit Number 4    Number of Visits 13    Date for PT Re-Evaluation 11/03/21    Authorization Type MEDICARE PART A AND B; BCBS SUPPLEMENT    PT Start Time 1317    PT Stop Time 1334    PT Time Calculation (min) 17 min              History reviewed. No pertinent past medical history. History reviewed. No pertinent surgical history. There are no problems to display for this patient.   REFERRING DIAG: M79.605 (ICD-10-CM) - Pain in left leg  THERAPY DIAG:  Pain in left leg  Muscle weakness (generalized)  Abnormal posture  Rationale for Evaluation and Treatment Rehabilitation   ONSET DATE:4-5 months   SUBJECTIVE:    SUBJECTIVE STATEMENT: Pt reports some low back and B knee soreness. Pt denies L lateral leg sharp pain.  PAIN:  Are you having pain? Yes: NPRS scale: 0/10 Pain location: L lalteral leg extending to the lateral foot Pain description: ache, sharp, throb, electric Aggravating factors: Cumulative activity, not a specific movement Relieving factors: Tylenol, walking Pain 3-10/10 Bilat knee 3/10, wrose 9/10 steps  PERTINENT HISTORY: High BMI   PRECAUTIONS: None   PATIENT GOALS: Relief of pain. Find out what's going on with my body.     OBJECTIVE: (objective measures completed at initial evaluation unless otherwise dated)    DIAGNOSTIC FINDINGS: NA   PATIENT SURVEYS:  Modified Oswestry To be assessed    COGNITION:           Overall cognitive status: Within functional limits for tasks assessed                          SENSATION: WFL   MUSCLE LENGTH: Hamstrings: min tight L and R Thomas test: TBA   POSTURE: marked increased lumbar lordosis   PALPATION: TPP B lateral  hips, L>R and to the L superior to the lateral iliac creast   Trunk AROMs:            WNLs for all movements and pt's pain was not reproduced. With forward flexion, pt's lordotic curve did not fully decrease   LOWER EXTREMITY ROM:                       Grossly WNLs. AROM of the L hip for all movements did not reproduce pt's pain.   LOWER EXTREMITY MMT:                       Grossly 4+ for legs. Weak core.   Lumabr SPECIAL TESTS:  Slump test and SLR were both negative   FUNCTIONAL TESTS:  5 times sit to stand: 10.3 sec s use of hands . WNLs   GAIT: Distance walked: 260ft Assistive device utilized: None Level of assistance: Complete Independence Comments: WNLs   TODAY'S TREATMENT: [facility] Adult PT Treatment:  DATE: 09/25/21 Therapeutic Exercise Nustep L 4 UE/LE x 5 min STS s UE from bariatric chair - added 5#  Seated hamstring stretch x2 30" Seated trunk flexion and lat flexion x2 each 15" Right, Supine hip flexor stretch on edge of mat  Bridge x 1 , 10 sec  Treatment discontinued at the request of the patient   [facility] Adult PT Treatment:                                                DATE: 09/22/21 Therapeutic Exercise: Seated hamstring stretch x2 20" Seated trunk flexion and lat flexion x2 each 15" Prone quad stretch x2 20" Piriformis stretch x2 20" PPT x10 3" Abdominal bracing 90d x5 10' SLR c quad set x10 3" Bridging x10 3' Hip abd GTB x10 3" Hip add sets ball squeeze x10 3"  [facility] Adult PT Treatment:                                                DATE: 09/21/21 Therapeutic Exercise: PPT x10 3" Abdominal bracing 90d x5 10' SLR c quad set x10 3" Bridging x10 3' Hip abd GTB x10 3" Hip add sets ball squeeze x10 3" Seated trunk flexion and side bending x2 20" each Sit to/from standing s hands x10 Self Care: Recommended pt completing marching and LAQs several reps to warm up knees knees prior to standing to minimize pain  with this activity after prolonged sitting  Eval Treatment: - Supine Posterior Pelvic Tilt hands sliding up thighs 10 reps - 3 hold - Seated Flexion Stretch 2 chair forward and laterally 4 reps  20 hold   PATIENT EDUCATION:  Education details: Eval findings, POC, HEP Person educated: Patient Education method: Explanation, Demonstration, Tactile cues, Verbal cues, and Handouts Education comprehension: verbalized understanding, returned demonstration, verbal cues required, and tactile cues required     HOME EXERCISE PROGRAM: Access Code: [email or web address] URL: [email or web address] Date: 09/22/2021 Prepared by: Joellyn Rued  Exercises - Seated Flexion Stretch with Swiss Ball  - 2-4 x daily - 7 x weekly - 1 sets - 2 reps - 20 hold - Seated Thoracic Flexion and Rotation with Swiss Ball  - 2-4 x daily - 7 x weekly - 1 sets - 2 reps - 20 hold - Supine 90/90 Abdominal Bracing  - 1-2 x daily - 7 x weekly - 1 sets - 5 reps - 10 hold - Supine Posterior Pelvic Tilt  - 1-2 x daily - 7 x weekly - 1 sets - 10 reps - 3 hold - Active Straight Leg Raise with Quad Set  - 1-2 x daily - 7 x weekly - 1 sets - 10 reps - 3 hold - Supine Bridge  - 1-2 x daily - 7 x weekly - 1 sets - 10 reps - 3 hold - Hooklying Clamshell with Resistance  - 1-2 x daily - 7 x weekly - 1 sets - 10 reps - 3 hold - Supine Hip Adduction Isometric with Ball  - 1-2 x daily - 7 x weekly - 1 sets - 10 reps - 3 hold - Sit to Stand Without Arm Support  - 1-2 x daily - 7 x weekly - 1 sets - 10 reps -  3 hold - Seated Hamstring Stretch  - 1-2 x daily - 7 x weekly - 1 sets - 3 reps - 20 hold - Prone Quadriceps Stretch with Strap  - 1-2 x daily - 7 x weekly - 1 sets - 3 reps - 20 hold - Supine Piriformis Stretch with Foot on Ground  - 1-2 x daily - 7 x weekly - 1 sets - 3 reps - 20 hold   ASSESSMENT:   CLINICAL IMPRESSION: Pt arrived reporting new onset of stomach cramps that might be due to her meal earlier. Able to complete 17  minutes of therex prior to pt request to discontinue treatment.   OBJECTIVE IMPAIRMENTS decreased strength, impaired flexibility, postural dysfunction, obesity, and pain.      GOALS:   SHORT TERM GOALS: = LTGs      LONG TERM GOALS: Target date: 11/03/21    PT will be ind in a final HEP to maintain achieved LOF Baseline: started on eval Goal status: INITIAL   2.  Pt will report improved L lateral leg with an intensity of 3/10 or less and a decreased frequency of 1 incident per 3 weeks with daily activities  Baseline:7/10, 2x per week  Goal status: INITIAL   3.  Pt will demonstrate improved core strength by maintaining a plank on knee and standard bridge for 30 sec for improved function with less pain Baseline:  Goal status: INITIAL   4.  Pt will demonstrate improved hamstring and and hip flexor ROM for improved function with less pain Baseline:  Goal status: INITIAL   PLAN: PT FREQUENCY: 2x/week   PT DURATION: 6 weeks   PLANNED INTERVENTIONS: Therapeutic exercises, Therapeutic activity, Patient/Family education, Joint mobilization, Aquatic Therapy, Dry Needling, Electrical stimulation, Spinal mobilization, Cryotherapy, Moist heat, Taping, Traction, Ultrasound, Ionotophoresis 4mg /ml Dexamethasone, Manual therapy, and Re-evaluation   PLAN FOR NEXT SESSION: Assess response to HEP; complete the oswestry; progress therx as indicated; use of modalities, manual therapy and TPDN as indicated   , PTA 09/25/21 1:39 PM Phone: [phone] Fax: [phone]

## 2021-09-26 NOTE — Therapy (Signed)
OUTPATIENT PHYSICAL THERAPY TREATMENT NOTE   Patient Name: Rose Burke MRN: [id number] DOB:[birth year], 66 y.o., female Today's Date: 09/27/2021  PCP: Kirby Funk, MD REFERRING PROVIDER: Kirby Funk, MD  END OF SESSION:   PT End of Session - 09/27/21 1304     Visit Number 5    Number of Visits 13    Date for PT Re-Evaluation 11/03/21    Authorization Type MEDICARE PART A AND B; BCBS SUPPLEMENT    PT Start Time 1153    PT Stop Time 1237    PT Time Calculation (min) 44 min    Activity Tolerance Patient tolerated treatment well    Behavior During Therapy [facility] for tasks assessed/performed               History reviewed. No pertinent past medical history. History reviewed. No pertinent surgical history. There are no problems to display for this patient.   REFERRING DIAG: M79.605 (ICD-10-CM) - Pain in left leg  THERAPY DIAG:  Pain in left leg  Muscle weakness (generalized)  Abnormal posture  Rationale for Evaluation and Treatment Rehabilitation   ONSET DATE:4-5 months   SUBJECTIVE:    SUBJECTIVE STATEMENT: Pt reports B knee soreness L>R . Pt denies L lateral leg pain  PAIN:  Are you having pain? Yes: L lateral leg pain NPRS scale: 0/10 Pain location: L lalteral leg extending to the lateral foot Pain description: ache, sharp, throb, electric Aggravating factors: Cumulative activity, not a specific movement Relieving factors: Tylenol, walking Bilat knee 3/10, wrose 9/10 steps  PERTINENT HISTORY: High BMI   PRECAUTIONS: None   PATIENT GOALS: Relief of pain. Find out what's going on with my body.     OBJECTIVE: (objective measures completed at initial evaluation unless otherwise dated)    DIAGNOSTIC FINDINGS: NA   PATIENT SURVEYS:  Modified Oswestry To be assessed    COGNITION:           Overall cognitive status: Within functional limits for tasks assessed                          SENSATION: WFL   MUSCLE LENGTH: Hamstrings: min tight  L and R Thomas test: TBA   POSTURE: marked increased lumbar lordosis   PALPATION: TPP B lateral hips, L>R and to the L superior to the lateral iliac creast   Trunk AROMs:            WNLs for all movements and pt's pain was not reproduced. With forward flexion, pt's lordotic curve did not fully decrease   LOWER EXTREMITY ROM:                       Grossly WNLs. AROM of the L hip for all movements did not reproduce pt's pain.   LOWER EXTREMITY MMT:                       Grossly 4+ for legs. Weak core.   Lumabr SPECIAL TESTS:  Slump test and SLR were both negative   FUNCTIONAL TESTS:  5 times sit to stand: 10.3 sec s use of hands . WNLs   GAIT: Distance walked: 275ft Assistive device utilized: None Level of assistance: Complete Independence Comments: WNLs   TODAY'S TREATMENT: [facility] Adult PT Treatment:  DATE: 09/27/21 Therapeutic Exercise: Nustep L 4 UE/LE x 5 min Supine SLR c 3# 2x10 Bridging x10 5" Supine hip add x10 5" Hip abd 2x10 GTB LAQ 2x10 3# STS 3x5 Seated hamstring stretch x2 30"  [facility] Adult PT Treatment:                                                DATE: 09/25/21 Therapeutic Exercise Nustep L 4 UE/LE x 5 min STS s UE from bariatric chair - added 5#  Seated hamstring stretch x2 30" Seated trunk flexion and lat flexion x2 each 15" Right, Supine hip flexor stretch on edge of mat  Bridge x 1 , 10 sec  Treatment discontinued at the request of the patient   [facility] Adult PT Treatment:                                                DATE: 09/22/21 Therapeutic Exercise: Seated hamstring stretch x2 20" Seated trunk flexion and lat flexion x2 each 15" Prone quad stretch x2 20" Piriformis stretch x2 20" PPT x10 3" Abdominal bracing 90d x5 10' SLR c quad set x10 3" Bridging x10 3' Hip abd GTB x10 3" Hip add sets ball squeeze x10 3"   PATIENT EDUCATION:  Education details: Eval findings, POC, HEP Person educated:  Patient Education method: Explanation, Demonstration, Tactile cues, Verbal cues, and Handouts Education comprehension: verbalized understanding, returned demonstration, verbal cues required, and tactile cues required     HOME EXERCISE PROGRAM: Access Code: [email or web address] URL: [email or web address] Date: 09/22/2021 Prepared by: Joellyn Rued  Exercises - Seated Flexion Stretch with Swiss Ball  - 2-4 x daily - 7 x weekly - 1 sets - 2 reps - 20 hold - Seated Thoracic Flexion and Rotation with Swiss Ball  - 2-4 x daily - 7 x weekly - 1 sets - 2 reps - 20 hold - Supine 90/90 Abdominal Bracing  - 1-2 x daily - 7 x weekly - 1 sets - 5 reps - 10 hold - Supine Posterior Pelvic Tilt  - 1-2 x daily - 7 x weekly - 1 sets - 10 reps - 3 hold - Active Straight Leg Raise with Quad Set  - 1-2 x daily - 7 x weekly - 1 sets - 10 reps - 3 hold - Supine Bridge  - 1-2 x daily - 7 x weekly - 1 sets - 10 reps - 3 hold - Hooklying Clamshell with Resistance  - 1-2 x daily - 7 x weekly - 1 sets - 10 reps - 3 hold - Supine Hip Adduction Isometric with Ball  - 1-2 x daily - 7 x weekly - 1 sets - 10 reps - 3 hold - Sit to Stand Without Arm Support  - 1-2 x daily - 7 x weekly - 1 sets - 10 reps - 3 hold - Seated Hamstring Stretch  - 1-2 x daily - 7 x weekly - 1 sets - 3 reps - 20 hold - Prone Quadriceps Stretch with Strap  - 1-2 x daily - 7 x weekly - 1 sets - 3 reps - 20 hold - Supine Piriformis Stretch with Foot on Ground  - 1-2 x daily - 7 x weekly - 1  sets - 3 reps - 20 hold   ASSESSMENT:   CLINICAL IMPRESSION: PT was completed for hip and knees strength as well as hamstring flexibility to improve pt's pain function. Pt participated with good effort and without increase in pain. Pt has been able to tolerate an increase in physical demand of her therex. Pt will continue to benefit from skilled PT to address impairments to improved function with less pain.   OBJECTIVE IMPAIRMENTS decreased strength, impaired  flexibility, postural dysfunction, obesity, and pain.      GOALS:   SHORT TERM GOALS: = LTGs      LONG TERM GOALS: Target date: 11/03/21    PT will be ind in a final HEP to maintain achieved LOF Baseline: started on eval Goal status: INITIAL   2.  Pt will report improved L lateral leg with an intensity of 3/10 or less and a decreased frequency of 1 incident per 3 weeks with daily activities  Baseline:7/10, 2x per week  Goal status: INITIAL   3.  Pt will demonstrate improved core strength by maintaining a plank on knee and standard bridge for 30 sec for improved function with less pain Baseline:  Goal status: INITIAL   4.  Pt will demonstrate improved hamstring and and hip flexor ROM for improved function with less pain Baseline:  Goal status: INITIAL   PLAN: PT FREQUENCY: 2x/week   PT DURATION: 6 weeks   PLANNED INTERVENTIONS: Therapeutic exercises, Therapeutic activity, Patient/Family education, Joint mobilization, Aquatic Therapy, Dry Needling, Electrical stimulation, Spinal mobilization, Cryotherapy, Moist heat, Taping, Traction, Ultrasound, Ionotophoresis 4mg /ml Dexamethasone, Manual therapy, and Re-evaluation   PLAN FOR NEXT SESSION: Assess response to HEP; complete the oswestry; progress therx as indicated; use of modalities, manual therapy and TPDN as indicated. Assess strength tolerance for maintaining a bridge and and plank.   Daleyssa Loiselle MS, PT 09/27/21 1:18 PM

## 2021-09-27 ENCOUNTER — Ambulatory Visit: Payer: Medicare Other

## 2021-09-27 DIAGNOSIS — M79605 Pain in left leg: Secondary | ICD-10-CM

## 2021-09-27 DIAGNOSIS — M6281 Muscle weakness (generalized): Secondary | ICD-10-CM

## 2021-09-27 DIAGNOSIS — R293 Abnormal posture: Secondary | ICD-10-CM | POA: Diagnosis not present

## 2021-09-27 DIAGNOSIS — H43813 Vitreous degeneration, bilateral: Secondary | ICD-10-CM | POA: Diagnosis not present

## 2021-10-02 ENCOUNTER — Ambulatory Visit: Payer: Medicare Other | Admitting: Physical Therapy

## 2021-10-02 ENCOUNTER — Encounter: Payer: Self-pay | Admitting: Physical Therapy

## 2021-10-02 DIAGNOSIS — M6281 Muscle weakness (generalized): Secondary | ICD-10-CM | POA: Diagnosis not present

## 2021-10-02 DIAGNOSIS — R293 Abnormal posture: Secondary | ICD-10-CM

## 2021-10-02 DIAGNOSIS — M79605 Pain in left leg: Secondary | ICD-10-CM | POA: Diagnosis not present

## 2021-10-04 ENCOUNTER — Ambulatory Visit: Payer: Medicare Other | Admitting: Physical Therapy

## 2021-10-04 ENCOUNTER — Encounter: Payer: Self-pay | Admitting: Physical Therapy

## 2021-10-04 DIAGNOSIS — R293 Abnormal posture: Secondary | ICD-10-CM

## 2021-10-04 DIAGNOSIS — M6281 Muscle weakness (generalized): Secondary | ICD-10-CM | POA: Diagnosis not present

## 2021-10-04 DIAGNOSIS — M79605 Pain in left leg: Secondary | ICD-10-CM | POA: Diagnosis not present

## 2021-10-04 NOTE — Therapy (Signed)
OUTPATIENT PHYSICAL THERAPY TREATMENT NOTE   Patient Name: Rose Burke MRN: [id number] DOB:03/20/1956, 66 y.o., female Today's Date: 10/04/2021  PCP: Kirby Funk, MD REFERRING PROVIDER: Kirby Funk, MD  END OF SESSION:   PT End of Session - 10/04/21 0853     Visit Number 7    Number of Visits 13    Date for PT Re-Evaluation 11/03/21    Authorization Type MEDICARE PART A AND B; BCBS SUPPLEMENT    PT Start Time 0850    PT Stop Time 0930    PT Time Calculation (min) 40 min               History reviewed. No pertinent past medical history. History reviewed. No pertinent surgical history. There are no problems to display for this patient.   REFERRING DIAG: M79.605 (ICD-10-CM) - Pain in left leg  THERAPY DIAG:  Pain in left leg  Muscle weakness (generalized)  Abnormal posture  Rationale for Evaluation and Treatment Rehabilitation   ONSET DATE:4-5 months   SUBJECTIVE:    SUBJECTIVE STATEMENT: The left hip tender spot is still there but no shooting pains lately. I want to be able to walk without left leg cramping at the knee feeling like it is going to give out.   PAIN:  Are you having pain? Yes: L lateral hip NPRS scale: 0/10 Pain location: L lalteral hip  Pain description: tender Aggravating factors: Cumulative activity, not a specific movement Relieving factors: Tylenol, walking Bilat knee 3/10, wrose 9/10 steps  PERTINENT HISTORY: High BMI   PRECAUTIONS: None   PATIENT GOALS: Relief of pain. Find out what's going on with my body.     OBJECTIVE: (objective measures completed at initial evaluation unless otherwise dated)    DIAGNOSTIC FINDINGS: NA   PATIENT SURVEYS:  Modified Oswestry To be assessed    COGNITION:           Overall cognitive status: Within functional limits for tasks assessed                          SENSATION: WFL   MUSCLE LENGTH: Hamstrings: min tight L and R Thomas test: TBA   POSTURE: marked increased lumbar  lordosis   PALPATION: TPP B lateral hips, L>R and to the L superior to the lateral iliac creast   Trunk AROMs:            WNLs for all movements and pt's pain was not reproduced. With forward flexion, pt's lordotic curve did not fully decrease   LOWER EXTREMITY ROM:                       Grossly WNLs. AROM of the L hip for all movements did not reproduce pt's pain.   LOWER EXTREMITY MMT:                       Grossly 4+ for legs. Weak core.   Lumabr SPECIAL TESTS:  Slump test and SLR were both negative   FUNCTIONAL TESTS:  5 times sit to stand: 10.3 sec s use of hands . WNLs   GAIT: Distance walked: 270ft Assistive device utilized: None Level of assistance: Complete Independence Comments: WNLs   TODAY'S TREATMENT: [facility] Adult PT Treatment:  DATE: 10/04/21 Therapeutic Exercise: Nustep L 5 UE/LE x 5 min STS 10# 10 x 2  Knee ext 5#  2 x 10 OMEGA Knee flexion 25#  x15 OMEGA Slant board stretch 2 x 30 sec Supine hamstring stretch with strap 3 x 30 sec Prone quad stretch with strap 2 x 30 sec  Seated childs pose with laterals    [facility] Adult PT Treatment:                                                DATE: 10/02/21 Therapeutic Exercise: Nustep L 5 UE/LE x 5 min STS 10# x 10 x 5  Knee ext 5# x 10 OMEGA Knee flexion 25#  x15 OMEGA Gastroc  2 x 30 Sec  each Seated hamstring stretch x2 30" 10 Supine SLR c 3# 2x10 Bridging x10 5" Supine hip add x10 5" CLAM 1x10 GTB Banded Bridge x 10 Ball squeeze 10 x 2  Bridge with ball squeeze x 10 Piriformis stretch x 2 each PPT x 10  Supine marching c PPT  Plank on knees 10 sec x 2 13 sec best   [facility] Adult PT Treatment:                                                DATE: 09/27/21 Therapeutic Exercise: Nustep L 4 UE/LE x 5 min Supine SLR c 3# 2x10 Bridging x10 5" Supine hip add x10 5" Hip abd 2x10 GTB LAQ 2x10 3# STS 3x5 Seated hamstring stretch x2 30"  [facility] Adult PT Treatment:                                                 DATE: 09/25/21 Therapeutic Exercise Nustep L 4 UE/LE x 5 min STS s UE from bariatric chair - added 5#  Seated hamstring stretch x2 30" Seated trunk flexion and lat flexion x2 each 15" Right, Supine hip flexor stretch on edge of mat  Bridge x 1 , 10 sec  Treatment discontinued at the request of the patient   [facility] Adult PT Treatment:                                                DATE: 09/22/21 Therapeutic Exercise: Seated hamstring stretch x2 20" Seated trunk flexion and lat flexion x2 each 15" Prone quad stretch x2 20" Piriformis stretch x2 20" PPT x10 3" Abdominal bracing 90d x5 10' SLR c quad set x10 3" Bridging x10 3' Hip abd GTB x10 3" Hip add sets ball squeeze x10 3"     PATIENT EDUCATION:  Education details: Eval findings, POC, HEP Person educated: Patient Education method: Explanation, Demonstration, Tactile cues, Verbal cues, and Handouts Education comprehension: verbalized understanding, returned demonstration, verbal cues required, and tactile cues required     HOME EXERCISE PROGRAM: Access Code: [email or web address] URL: [email or web address] Date: 09/22/2021 Prepared by: Joellyn Rued  Exercises - Seated Flexion Stretch with Swiss Ball  - 2-4 x daily -  7 x weekly - 1 sets - 2 reps - 20 hold - Seated Thoracic Flexion and Rotation with Swiss Ball  - 2-4 x daily - 7 x weekly - 1 sets - 2 reps - 20 hold - Supine 90/90 Abdominal Bracing  - 1-2 x daily - 7 x weekly - 1 sets - 5 reps - 10 hold - Supine Posterior Pelvic Tilt  - 1-2 x daily - 7 x weekly - 1 sets - 10 reps - 3 hold - Active Straight Leg Raise with Quad Set  - 1-2 x daily - 7 x weekly - 1 sets - 10 reps - 3 hold - Supine Bridge  - 1-2 x daily - 7 x weekly - 1 sets - 10 reps - 3 hold - Hooklying Clamshell with Resistance  - 1-2 x daily - 7 x weekly - 1 sets - 10 reps - 3 hold - Supine Hip Adduction Isometric with Ball  - 1-2 x daily - 7 x weekly - 1 sets - 10  reps - 3 hold - Sit to Stand Without Arm Support  - 1-2 x daily - 7 x weekly - 1 sets - 10 reps - 3 hold - Seated Hamstring Stretch  - 1-2 x daily - 7 x weekly - 1 sets - 3 reps - 20 hold - Prone Quadriceps Stretch with Strap  - 1-2 x daily - 7 x weekly - 1 sets - 3 reps - 20 hold - Supine Piriformis Stretch with Foot on Ground  - 1-2 x daily - 7 x weekly - 1 sets - 3 reps - 20 hold   ASSESSMENT:   CLINICAL IMPRESSION: PT was completed for hip and knees strength as well as hamstring/quad  flexibility to improve pt's pain function. Pt reports she was sore in thighs and lower back after last session. Continued gym machines and general LE and core strengthening. Pt has not had the shooting pain that she was referred to PT for however does reports left knee carmping/feeling like it will give way when walking longer distance. Pt will continue to benefit from skilled PT to address impairments to improved function with less pain.   OBJECTIVE IMPAIRMENTS decreased strength, impaired flexibility, postural dysfunction, obesity, and pain.      GOALS:   SHORT TERM GOALS: = LTGs      LONG TERM GOALS: Target date: 11/03/21    PT will be ind in a final HEP to maintain achieved LOF Baseline: started on eval Goal status: INITIAL   2.  Pt will report improved L lateral leg with an intensity of 3/10 or less and a decreased frequency of 1 incident per 3 weeks with daily activities  Baseline:7/10, 2x per week  status Goal status: INITIAL   3.  Pt will demonstrate improved core strength by maintaining a plank on knee and standard bridge for 30 sec for improved function with less pain Baseline:  Goal status: INITIAL   4.  Pt will demonstrate improved hamstring and and hip flexor ROM for improved function with less pain Baseline:  Goal status: INITIAL   PLAN: PT FREQUENCY: 2x/week   PT DURATION: 6 weeks   PLANNED INTERVENTIONS: Therapeutic exercises, Therapeutic activity, Patient/Family education,  Joint mobilization, Aquatic Therapy, Dry Needling, Electrical stimulation, Spinal mobilization, Cryotherapy, Moist heat, Taping, Traction, Ultrasound, Ionotophoresis 4mg /ml Dexamethasone, Manual therapy, and Re-evaluation   PLAN FOR NEXT SESSION: begin checking LTGs.; Assess response to HEP; complete the oswestry; progress therx as indicated; use of modalities, manual  therapy and TPDN as indicated. Assess strength tolerance for maintaining a bridge and and plank (plank 13 sec on 10/02/21) .   Jannette Spanner, PTA 10/04/21 10:29 AM Phone: [phone] Fax: [phone]

## 2021-10-09 ENCOUNTER — Encounter: Payer: Self-pay | Admitting: Physical Therapy

## 2021-10-09 ENCOUNTER — Ambulatory Visit: Payer: Medicare Other | Attending: Internal Medicine | Admitting: Physical Therapy

## 2021-10-09 DIAGNOSIS — R293 Abnormal posture: Secondary | ICD-10-CM | POA: Insufficient documentation

## 2021-10-09 DIAGNOSIS — M79605 Pain in left leg: Secondary | ICD-10-CM | POA: Insufficient documentation

## 2021-10-09 DIAGNOSIS — M6281 Muscle weakness (generalized): Secondary | ICD-10-CM | POA: Insufficient documentation

## 2021-10-09 NOTE — Therapy (Signed)
OUTPATIENT PHYSICAL THERAPY TREATMENT NOTE   Patient Name: Rose Burke MRN: [id number] DOB:[birth year], 66 y.o., female Today's Date: 10/09/2021  PCP: Lavone Orn, MD REFERRING PROVIDER: Lavone Orn, MD  END OF SESSION:   PT End of Session - 10/09/21 0851     Visit Number 8    Number of Visits 13    Date for PT Re-Evaluation 11/03/21    Authorization Type MEDICARE PART A AND B; BCBS SUPPLEMENT    PT Start Time 0848    PT Stop Time 0930    PT Time Calculation (min) 42 min               History reviewed. No pertinent past medical history. History reviewed. No pertinent surgical history. There are no problems to display for this patient.   REFERRING DIAG: M79.605 (ICD-10-CM) - Pain in left leg  THERAPY DIAG:  Pain in left leg  Muscle weakness (generalized)  Abnormal posture  Rationale for Evaluation and Treatment Rehabilitation   ONSET DATE:4-5 months   SUBJECTIVE:    SUBJECTIVE STATEMENT: No leg pain today. Had leg pains yesterday. Sitting aggravates the legs. Also, a lot of yard work aggravates the back of both legs. The side of the left hip is less tender. I do the stretches mostly.   PAIN:  Are you having pain? Yes: L lateral hip NPRS scale: 0/10 Pain location: L lalteral hip  Pain description: tender Aggravating factors: Cumulative activity, not a specific movement Relieving factors: Tylenol, walking Bilat knee 3/10, wrose 9/10 steps  PERTINENT HISTORY: High BMI   PRECAUTIONS: None   PATIENT GOALS: Relief of pain. Find out what's going on with my body.     OBJECTIVE: (objective measures completed at initial evaluation unless otherwise dated)    DIAGNOSTIC FINDINGS: NA   PATIENT SURVEYS:  Modified Oswestry To be assessed    COGNITION:           Overall cognitive status: Within functional limits for tasks assessed                          SENSATION: WFL   MUSCLE LENGTH: Hamstrings: min tight L and R Thomas test: TBA    POSTURE: marked increased lumbar lordosis   PALPATION: TPP B lateral hips, L>R and to the L superior to the lateral iliac creast   Trunk AROMs:            WNLs for all movements and pt's pain was not reproduced. With forward flexion, pt's lordotic curve did not fully decrease   LOWER EXTREMITY ROM:                       Grossly WNLs. AROM of the L hip for all movements did not reproduce pt's pain.   LOWER EXTREMITY MMT:                       Grossly 4+ for legs. Weak core.   Lumabr SPECIAL TESTS:  Slump test and SLR were both negative   FUNCTIONAL TESTS:  5 times sit to stand: 10.3 sec s use of hands . WNLs 10/04/21: plank on knees 13 sec best 10/09/21: plank on knees 45 sec best 10/09/21: Bridge 60 sec    GAIT: Distance walked: 258ft Assistive device utilized: None Level of assistance: Complete Independence Comments: WNLs   TODAY'S TREATMENT: [facility] Adult PT Treatment:  DATE: 10/09/21 Therapeutic Exercise: Nustep L4 x 6 minutes  Seated hamstring stretch 15# STS x 10 standard chair  45 sec plank on knees  60 sec Bridge  Supine hamstring stretch Hip flexor stretch EOM  Piriformis stretch x 2 each  Knee ext 10#  1 x 10 OMEGA Knee flexion 25#  x15 OMEGA Slant board stretch 45 sec    [facility] Adult PT Treatment:                                                DATE: 10/04/21 Therapeutic Exercise: Nustep L 5 UE/LE x 5 min STS 10# 10 x 2  Knee ext 5#  2 x 10 OMEGA Knee flexion 25#  x15 OMEGA Slant board stretch 2 x 30 sec Supine hamstring stretch with strap 3 x 30 sec Prone quad stretch with strap 2 x 30 sec  Seated childs pose with laterals    [facility] Adult PT Treatment:                                                DATE: 10/02/21 Therapeutic Exercise: Nustep L 5 UE/LE x 5 min STS 10# x 10 x 5  Knee ext 5# x 10 OMEGA Knee flexion 25#  x15 OMEGA Gastroc  2 x 30 Sec  each Seated hamstring stretch x2 30" 10 Supine SLR c 3#  2x10 Bridging x10 5" Supine hip add x10 5" CLAM 1x10 GTB Banded Bridge x 10 Ball squeeze 10 x 2  Bridge with ball squeeze x 10 Piriformis stretch x 2 each PPT x 10  Supine marching c PPT  Plank on knees 10 sec x 2 13 sec best   [facility] Adult PT Treatment:                                                DATE: 09/27/21 Therapeutic Exercise: Nustep L 4 UE/LE x 5 min Supine SLR c 3# 2x10 Bridging x10 5" Supine hip add x10 5" Hip abd 2x10 GTB LAQ 2x10 3# STS 3x5 Seated hamstring stretch x2 30"  [facility] Adult PT Treatment:                                                DATE: 09/25/21 Therapeutic Exercise Nustep L 4 UE/LE x 5 min STS s UE from bariatric chair - added 5#  Seated hamstring stretch x2 30" Seated trunk flexion and lat flexion x2 each 15" Right, Supine hip flexor stretch on edge of mat  Bridge x 1 , 10 sec  Treatment discontinued at the request of the patient   [facility] Adult PT Treatment:                                                DATE: 09/22/21 Therapeutic Exercise: Seated hamstring stretch x2 20" Seated  trunk flexion and lat flexion x2 each 15" Prone quad stretch x2 20" Piriformis stretch x2 20" PPT x10 3" Abdominal bracing 90d x5 10' SLR c quad set x10 3" Bridging x10 3' Hip abd GTB x10 3" Hip add sets ball squeeze x10 3"     PATIENT EDUCATION:  Education details: Eval findings, POC, HEP Person educated: Patient Education method: Explanation, Demonstration, Tactile cues, Verbal cues, and Handouts Education comprehension: verbalized understanding, returned demonstration, verbal cues required, and tactile cues required     HOME EXERCISE PROGRAM: Access Code: [email or web address] URL: [email or web address] Date: 09/22/2021 Prepared by: Gar Ponto  Exercises - Seated Flexion Stretch with Swiss Ball  - 2-4 x daily - 7 x weekly - 1 sets - 2 reps - 20 hold - Seated Thoracic Flexion and Rotation with Swiss Ball  - 2-4 x daily - 7 x weekly - 1 sets - 2 reps -  20 hold - Supine 90/90 Abdominal Bracing  - 1-2 x daily - 7 x weekly - 1 sets - 5 reps - 10 hold - Supine Posterior Pelvic Tilt  - 1-2 x daily - 7 x weekly - 1 sets - 10 reps - 3 hold - Active Straight Leg Raise with Quad Set  - 1-2 x daily - 7 x weekly - 1 sets - 10 reps - 3 hold - Supine Bridge  - 1-2 x daily - 7 x weekly - 1 sets - 10 reps - 3 hold - Hooklying Clamshell with Resistance  - 1-2 x daily - 7 x weekly - 1 sets - 10 reps - 3 hold - Supine Hip Adduction Isometric with Ball  - 1-2 x daily - 7 x weekly - 1 sets - 10 reps - 3 hold - Sit to Stand Without Arm Support  - 1-2 x daily - 7 x weekly - 1 sets - 10 reps - 3 hold - Seated Hamstring Stretch  - 1-2 x daily - 7 x weekly - 1 sets - 3 reps - 20 hold - Prone Quadriceps Stretch with Strap  - 1-2 x daily - 7 x weekly - 1 sets - 3 reps - 20 hold - Supine Piriformis Stretch with Foot on Ground  - 1-2 x daily - 7 x weekly - 1 sets - 3 reps - 20 hold   ASSESSMENT:   CLINICAL IMPRESSION: PT was completed for hip, knee, and core strength as well as hamstring/quad  flexibility to improve pt's pain function. Pt reports some abdominal cramping today. She has intermittent leg cramps and pain in posterior thighs with prolonged activity or sitting. She has met bridge and plank goals.  She is performing stretches at home however has not been consistent with strengthening portion of HEP. She reports decreased tenderness in lateral left hip. Pt will continue to benefit from skilled PT to address impairments to improved function with less pain.   OBJECTIVE IMPAIRMENTS decreased strength, impaired flexibility, postural dysfunction, obesity, and pain.      GOALS:   SHORT TERM GOALS: = LTGs      LONG TERM GOALS: Target date: 11/03/21    PT will be ind in a final HEP to maintain achieved LOF Baseline: started on eval Status 10/09/21: performs stretch portion of HEP  Goal status: ONGOING   2.  Pt will report improved L lateral leg with an intensity  of 3/10 or less and a decreased frequency of 1 incident per 3 weeks with daily activities  Baseline:7/10, 2x per week  Status 10/09/21:  less shooting pain on left, has bilateral posterior thigh pains and cramps with activity and sitting, has tenderness left lateral hip although improved.  Goal status: ONGOING   3.  Pt will demonstrate improved core strength by maintaining a plank on knee and standard bridge for 30 sec for improved function with less pain Baseline: plank 13 sec  Status 10/09/21: plank on knees 45 sec, bridge 60 sec Goal status: MET   4.  Pt will demonstrate improved hamstring and and hip flexor ROM for improved function with less pain Baseline:  Goal status: ONGOING   PLAN: PT FREQUENCY: 2x/week   PT DURATION: 6 weeks   PLANNED INTERVENTIONS: Therapeutic exercises, Therapeutic activity, Patient/Family education, Joint mobilization, Aquatic Therapy, Dry Needling, Electrical stimulation, Spinal mobilization, Cryotherapy, Moist heat, Taping, Traction, Ultrasound, Ionotophoresis 4mg /ml Dexamethasone, Manual therapy, and Re-evaluation   PLAN FOR NEXT SESSION: begin checking LTGs.; address lateral hip tenderness; Assess response to HEP; complete the oswestry; progress therx as indicated; use of modalities, manual therapy and TPDN as indicated. Assess strength tolerance for maintaining a bridge (60 sec 10/09/21) and and plank (plank 13 sec on 10/02/21, 45 sec 10/09/21) .   Hessie Diener, PTA 10/09/21 10:04 AM Phone: [phone] Fax: [phone]

## 2021-10-11 ENCOUNTER — Ambulatory Visit: Payer: Medicare Other | Admitting: Physical Therapy

## 2021-10-11 ENCOUNTER — Encounter: Payer: Self-pay | Admitting: Physical Therapy

## 2021-10-11 DIAGNOSIS — R293 Abnormal posture: Secondary | ICD-10-CM

## 2021-10-11 DIAGNOSIS — M79605 Pain in left leg: Secondary | ICD-10-CM

## 2021-10-11 DIAGNOSIS — M6281 Muscle weakness (generalized): Secondary | ICD-10-CM

## 2021-10-11 NOTE — Therapy (Signed)
OUTPATIENT PHYSICAL THERAPY TREATMENT NOTE   Patient Name: Rose Burke MRN: [id number] DOB:[birth year], 66 y.o., female Today's Date: 10/11/2021  PCP: Lavone Orn, MD REFERRING PROVIDER: Lavone Orn, MD  END OF SESSION:   PT End of Session - 10/11/21 0937     Visit Number 9    Number of Visits 13    Date for PT Re-Evaluation 11/03/21    Authorization Type MEDICARE PART A AND B; BCBS SUPPLEMENT    PT Start Time 0932    PT Stop Time 1015    PT Time Calculation (min) 43 min               History reviewed. No pertinent past medical history. History reviewed. No pertinent surgical history. There are no problems to display for this patient.   REFERRING DIAG: M79.605 (ICD-10-CM) - Pain in left leg  THERAPY DIAG:  Pain in left leg  Muscle weakness (generalized)  Abnormal posture  Rationale for Evaluation and Treatment Rehabilitation   ONSET DATE:4-5 months   SUBJECTIVE:    SUBJECTIVE STATEMENT: I am about the same. I did a lot of bending yesterday, planting flowers.   PAIN:  Are you having pain? Yes: L lateral hip NPRS scale: 0/10 Pain location: L lalteral hip  Pain description: tender Aggravating factors: Cumulative activity, not a specific movement Relieving factors: Tylenol, walking Bilat knee 3/10, wrose 9/10 steps  PERTINENT HISTORY: High BMI   PRECAUTIONS: None   PATIENT GOALS: Relief of pain. Find out what's going on with my body.     OBJECTIVE: (objective measures completed at initial evaluation unless otherwise dated)    DIAGNOSTIC FINDINGS: NA   PATIENT SURVEYS:  Modified Oswestry To be assessed    COGNITION:           Overall cognitive status: Within functional limits for tasks assessed                          SENSATION: WFL   MUSCLE LENGTH: Hamstrings: min tight L and R Thomas test: TBA   POSTURE: marked increased lumbar lordosis   PALPATION: TPP B lateral hips, L>R and to the L superior to the lateral iliac  creast   Trunk AROMs:            WNLs for all movements and pt's pain was not reproduced. With forward flexion, pt's lordotic curve did not fully decrease   LOWER EXTREMITY ROM:                       Grossly WNLs. AROM of the L hip for all movements did not reproduce pt's pain.   LOWER EXTREMITY MMT:                       Grossly 4+ for legs. Weak core.   Lumabr SPECIAL TESTS:  Slump test and SLR were both negative   FUNCTIONAL TESTS:  5 times sit to stand: 10.3 sec s use of hands . WNLs 10/04/21: plank on knees 13 sec best 10/09/21: plank on knees 45 sec best 10/09/21: Bridge 60 sec    GAIT: Distance walked: 27ft Assistive device utilized: None Level of assistance: Complete Independence Comments: WNLs   TODAY'S TREATMENT: [facility] Adult PT Treatment:  DATE: 10/11/21 Therapeutic Exercise: Nustep L5 x 5 minutes  Knee ext 10#  2 x 10 OMEGA Knee flexion 25#  2 x 10 OMEGA Seated childs pose with ball, forward and lateral 47 sec plank on knees  Side clam x 10 each Side hip abduction x 10 each  Bridge with Blue Band x 10 Supine hamstring stretch bilat Supine piriformis stretch bilat Hip flexor stretch bilat   [facility] Adult PT Treatment:                                                DATE: 10/09/21 Therapeutic Exercise: Nustep L4 x 6 minutes  Seated hamstring stretch 15# STS x 10 standard chair  45 sec plank on knees  60 sec Bridge  Supine hamstring stretch Hip flexor stretch EOM  Piriformis stretch x 2 each  Knee ext 10#  1 x 10 OMEGA Knee flexion 25#  x15 OMEGA Slant board stretch 45 sec    [facility] Adult PT Treatment:                                                DATE: 10/04/21 Therapeutic Exercise: Nustep L 5 UE/LE x 5 min STS 10# 10 x 2  Knee ext 5#  2 x 10 OMEGA Knee flexion 25#  x15 OMEGA Slant board stretch 2 x 30 sec Supine hamstring stretch with strap 3 x 30 sec Prone quad stretch with strap 2 x 30 sec  Seated childs pose  with laterals    [facility] Adult PT Treatment:                                                DATE: 10/02/21 Therapeutic Exercise: Nustep L 5 UE/LE x 5 min STS 10# x 10 x 5  Knee ext 5# x 10 OMEGA Knee flexion 25#  x15 OMEGA Gastroc  2 x 30 Sec  each Seated hamstring stretch x2 30" 10 Supine SLR c 3# 2x10 Bridging x10 5" Supine hip add x10 5" CLAM 1x10 GTB Banded Bridge x 10 Ball squeeze 10 x 2  Bridge with ball squeeze x 10 Piriformis stretch x 2 each PPT x 10  Supine marching c PPT  Plank on knees 10 sec x 2 13 sec best        PATIENT EDUCATION:  Education details: Eval findings, POC, HEP Person educated: Patient Education method: Explanation, Demonstration, Tactile cues, Verbal cues, and Handouts Education comprehension: verbalized understanding, returned demonstration, verbal cues required, and tactile cues required     HOME EXERCISE PROGRAM: Access Code: [email or web address] URL: [email or web address] Date: 09/22/2021 Prepared by: Gar Ponto  Exercises - Seated Flexion Stretch with Swiss Ball  - 2-4 x daily - 7 x weekly - 1 sets - 2 reps - 20 hold - Seated Thoracic Flexion and Rotation with Swiss Ball  - 2-4 x daily - 7 x weekly - 1 sets - 2 reps - 20 hold - Supine 90/90 Abdominal Bracing  - 1-2 x daily - 7 x weekly - 1 sets - 5 reps - 10 hold - Supine Posterior Pelvic Tilt  -  1-2 x daily - 7 x weekly - 1 sets - 10 reps - 3 hold - Active Straight Leg Raise with Quad Set  - 1-2 x daily - 7 x weekly - 1 sets - 10 reps - 3 hold - Supine Bridge  - 1-2 x daily - 7 x weekly - 1 sets - 10 reps - 3 hold - Hooklying Clamshell with Resistance  - 1-2 x daily - 7 x weekly - 1 sets - 10 reps - 3 hold - Supine Hip Adduction Isometric with Ball  - 1-2 x daily - 7 x weekly - 1 sets - 10 reps - 3 hold - Sit to Stand Without Arm Support  - 1-2 x daily - 7 x weekly - 1 sets - 10 reps - 3 hold - Seated Hamstring Stretch  - 1-2 x daily - 7 x weekly - 1 sets - 3 reps - 20 hold - Prone  Quadriceps Stretch with Strap  - 1-2 x daily - 7 x weekly - 1 sets - 3 reps - 20 hold - Supine Piriformis Stretch with Foot on Ground  - 1-2 x daily - 7 x weekly - 1 sets - 3 reps - 20 hold   ASSESSMENT:   CLINICAL IMPRESSION: PT was completed for hip, knee, and core strength as well as hamstring/quad  flexibility to improve pt's pain function. Pt reports some discomfort yesterday with planting flowers that requires a lot of bending. Reviewed HEP and recommended pt resume strengthening portions of her HEP to build on her hip strength. In side lying she demonstrates lateral hip weakness.  Pt will continue to benefit from skilled PT to address impairments to improved function with less pain.   OBJECTIVE IMPAIRMENTS decreased strength, impaired flexibility, postural dysfunction, obesity, and pain.      GOALS:   SHORT TERM GOALS: = LTGs      LONG TERM GOALS: Target date: 11/03/21    PT will be ind in a final HEP to maintain achieved LOF Baseline: started on eval Status 10/09/21: performs stretch portion of HEP  Goal status: ONGOING   2.  Pt will report improved L lateral leg with an intensity of 3/10 or less and a decreased frequency of 1 incident per 3 weeks with daily activities  Baseline:7/10, 2x per week  Status 10/09/21: less shooting pain on left, has bilateral posterior thigh pains and cramps with activity and sitting, has tenderness left lateral hip although improved.  Goal status: ONGOING   3.  Pt will demonstrate improved core strength by maintaining a plank on knee and standard bridge for 30 sec for improved function with less pain Baseline: plank 13 sec  Status 10/09/21: plank on knees 45 sec, bridge 60 sec Goal status: MET   4.  Pt will demonstrate improved hamstring and and hip flexor ROM for improved function with less pain Baseline:  Goal status: ONGOING   PLAN: PT FREQUENCY: 2x/week   PT DURATION: 6 weeks   PLANNED INTERVENTIONS: Therapeutic exercises, Therapeutic  activity, Patient/Family education, Joint mobilization, Aquatic Therapy, Dry Needling, Electrical stimulation, Spinal mobilization, Cryotherapy, Moist heat, Taping, Traction, Ultrasound, Ionotophoresis 4mg /ml Dexamethasone, Manual therapy, and Re-evaluation   PLAN FOR NEXT SESSION: begin checking LTGs.; lateral hp strength, hamstring and hip flexor flexibility.    Hessie Diener, PTA 10/11/21 10:03 AM Phone: [phone] Fax: [phone]

## 2021-10-18 ENCOUNTER — Ambulatory Visit: Payer: Medicare Other

## 2021-10-18 ENCOUNTER — Other Ambulatory Visit: Payer: Self-pay | Admitting: Internal Medicine

## 2021-10-18 DIAGNOSIS — M6281 Muscle weakness (generalized): Secondary | ICD-10-CM

## 2021-10-18 DIAGNOSIS — M79605 Pain in left leg: Secondary | ICD-10-CM | POA: Diagnosis not present

## 2021-10-18 DIAGNOSIS — I10 Essential (primary) hypertension: Secondary | ICD-10-CM

## 2021-10-18 DIAGNOSIS — R293 Abnormal posture: Secondary | ICD-10-CM

## 2021-10-18 NOTE — Therapy (Signed)
OUTPATIENT PHYSICAL THERAPY TREATMENT NOTE/Progress Note   Patient Name: Rose Burke MRN: [id number] DOB:[birth year], 66 y.o., female Today's Date: 10/18/2021  PCP: Lavone Orn, MD REFERRING PROVIDER: Lavone Orn, MD  END OF SESSION:   PT End of Session - 10/18/21 0855     Visit Number 10    Number of Visits 13    Date for PT Re-Evaluation 11/03/21    Authorization Type MEDICARE PART A AND B; BCBS SUPPLEMENT    PT Start Time 0847    PT Stop Time 0930    PT Time Calculation (min) 43 min    Activity Tolerance Patient tolerated treatment well    Behavior During Therapy [facility] for tasks assessed/performed                History reviewed. No pertinent past medical history. History reviewed. No pertinent surgical history. There are no problems to display for this patient.   REFERRING DIAG: M79.605 (ICD-10-CM) - Pain in left leg  THERAPY DIAG:  Pain in left leg  Muscle weakness (generalized)  Abnormal posture  Rationale for Evaluation and Treatment Rehabilitation   ONSET DATE:4-5 months   SUBJECTIVE:    SUBJECTIVE STATEMENT: I'm experiencing cramps of my feet and legs periodically. My knees bother me particularly with sitting either in a chair with knees bent or when in a recliner with them straight.  PAIN:  Are you having pain? Yes: L lateral hip NPRS scale: 0/10 Pain location: L lalteral hip  Pain description: tender Aggravating factors: Cumulative activity, not a specific movement Relieving factors: Tylenol, walking Bilat knee 3/10, wrose 9/10 steps  PERTINENT HISTORY: High BMI   PRECAUTIONS: None   PATIENT GOALS: Relief of pain. Find out what's going on with my body.     OBJECTIVE: (objective measures completed at initial evaluation unless otherwise dated)    DIAGNOSTIC FINDINGS: NA   PATIENT SURVEYS:  Modified Oswestry To be assessed    COGNITION:           Overall cognitive status: Within functional limits for tasks assessed                           SENSATION: WFL   MUSCLE LENGTH: Hamstrings: min tight L and R Thomas test: TBA   POSTURE: marked increased lumbar lordosis   PALPATION: TPP B lateral hips, L>R and to the L superior to the lateral iliac creast   Trunk AROMs:            WNLs for all movements and pt's pain was not reproduced. With forward flexion, pt's lordotic curve did not fully decrease   LOWER EXTREMITY ROM:                       Grossly WNLs. AROM of the L hip for all movements did not reproduce pt's pain.   LOWER EXTREMITY MMT:                       Grossly 4+ for legs. Weak core.   Lumabr SPECIAL TESTS:  Slump test and SLR were both negative   FUNCTIONAL TESTS:  5 times sit to stand: 10.3 sec s use of hands . WNLs 10/04/21: plank on knees 13 sec best 10/09/21: plank on knees 45 sec best 10/09/21: Bridge 60 sec  10/18/21 Plank on knees 60"   GAIT: Distance walked: 228ft Assistive device utilized: None Level of assistance: Complete Independence Comments: WNLs  TODAY'S TREATMENT: [facility] Adult PT Treatment:                                                DATE: 10/18/21 Therapeutic Exercise: Nustep L5 x 5 minutes  Standing ankle PF/foot book stretch 2x30 LAQ 5#  2x10 OMEGA SLR 2x10 3# 60 sec plank on knees  Planks on knees x10 10" Side clam 2x10 each GTB Side hip abduction x 10 each 3# Bridge with Blue Band 2x10 Self Care: Education re: muscle cramps and spasms with stretching when they occur and drinking plenty of fluids with gardening in heat and humidity and exercise for prevention. Education for knee motion after periods of rest with knees in a static position to promote joint lubriaction prior to activity     [facility] Adult PT Treatment:                                                DATE: 10/11/21 Therapeutic Exercise: Nustep L5 x 5 minutes  Knee ext 10#  2 x 10 OMEGA Knee flexion 25#  2 x 10 OMEGA SLR 2x10 Seated childs pose with ball, forward and lateral 47 sec plank on knees   Side clam x 10 each Side hip abduction x 10 each  Bridge with Blue Band x 10 Supine hamstring stretch bilat Supine piriformis stretch bilat Hip flexor stretch bilat   [facility] Adult PT Treatment:                                                DATE: 10/09/21 Therapeutic Exercise: Nustep L4 x 6 minutes  Seated hamstring stretch 15# STS x 10 standard chair  45 sec plank on knees  60 sec Bridge  Supine hamstring stretch Hip flexor stretch EOM  Piriformis stretch x 2 each  Knee ext 10#  1 x 10 OMEGA Knee flexion 25#  x15 OMEGA Slant board stretch 45 sec    PATIENT EDUCATION:  Education details: Eval findings, POC, HEP Person educated: Patient Education method: Explanation, Demonstration, Tactile cues, Verbal cues, and Handouts Education comprehension: verbalized understanding, returned demonstration, verbal cues required, and tactile cues required     HOME EXERCISE PROGRAM: Access Code: [email or web address] URL: [email or web address] Date: 09/22/2021 Prepared by: Gar Ponto  Exercises - Seated Flexion Stretch with Swiss Ball  - 2-4 x daily - 7 x weekly - 1 sets - 2 reps - 20 hold - Seated Thoracic Flexion and Rotation with Swiss Ball  - 2-4 x daily - 7 x weekly - 1 sets - 2 reps - 20 hold - Supine 90/90 Abdominal Bracing  - 1-2 x daily - 7 x weekly - 1 sets - 5 reps - 10 hold - Supine Posterior Pelvic Tilt  - 1-2 x daily - 7 x weekly - 1 sets - 10 reps - 3 hold - Active Straight Leg Raise with Quad Set  - 1-2 x daily - 7 x weekly - 1 sets - 10 reps - 3 hold - Supine Bridge  - 1-2 x daily - 7 x weekly - 1 sets - 10 reps - 3  hold - Hooklying Clamshell with Resistance  - 1-2 x daily - 7 x weekly - 1 sets - 10 reps - 3 hold - Supine Hip Adduction Isometric with Ball  - 1-2 x daily - 7 x weekly - 1 sets - 10 reps - 3 hold - Sit to Stand Without Arm Support  - 1-2 x daily - 7 x weekly - 1 sets - 10 reps - 3 hold - Seated Hamstring Stretch  - 1-2 x daily - 7 x weekly - 1 sets - 3 reps  - 20 hold - Prone Quadriceps Stretch with Strap  - 1-2 x daily - 7 x weekly - 1 sets - 3 reps - 20 hold - Supine Piriformis Stretch with Foot on Ground  - 1-2 x daily - 7 x weekly - 1 sets - 3 reps - 20 hold   ASSESSMENT:   CLINICAL IMPRESSION: Provided education re: leg and feet cramps and knee pain with prolong sitting. Pt continues to deny issues with L hip/lateral leg pain. PT was completed for lumbopelvic and LE strengthening as well as stretching of the ankle PFs and feet for cramps. Pt demonstrated improved core strength completing a plank from knees for 60" vs 13 " on the eval. Pt tolerated PT without adverse effects. Pt will continue to benefit from skilled PT to address impairments to improved function with less pain.   OBJECTIVE IMPAIRMENTS decreased strength, impaired flexibility, postural dysfunction, obesity, and pain.      GOALS:   SHORT TERM GOALS: = LTGs      LONG TERM GOALS: Target date: 11/03/21    PT will be ind in a final HEP to maintain achieved LOF Baseline: started on eval Status 10/09/21: performs stretch portion of HEP  Goal status: ONGOING   2.  Pt will report improved L lateral leg with an intensity of 3/10 or less and a decreased frequency of 1 incident per 3 weeks with daily activities  Baseline:7/10, 2x per week  Status 10/09/21: less shooting pain on left, has bilateral posterior thigh pains and cramps with activity and sitting, has tenderness left lateral hip although improved.  10/19/21: 0/10 L hip and posterior/lat thigh Goal status: Improviing   3.  Pt will demonstrate improved core strength by maintaining a plank on knee and standard bridge for 30 sec for improved function with less pain Baseline: plank 13 sec  Status 10/09/21: plank on knees 45 sec, bridge 60 sec. 10/19/21: 60 sec  Goal status: MET   4.  Pt will demonstrate improved hamstring and and hip flexor ROM for improved function with less pain Baseline:  Goal status: ONGOING   PLAN: PT  FREQUENCY: 2x/week   PT DURATION: 6 weeks   PLANNED INTERVENTIONS: Therapeutic exercises, Therapeutic activity, Patient/Family education, Joint mobilization, Aquatic Therapy, Dry Needling, Electrical stimulation, Spinal mobilization, Cryotherapy, Moist heat, Taping, Traction, Ultrasound, Ionotophoresis 4mg /ml Dexamethasone, Manual therapy, and Re-evaluation   PLAN FOR NEXT SESSION: check LTGs.; lateral hp strength, hamstring and hip flexor flexibility; development of final HEP     Harce Volden MS, PT 10/18/21 1:15 PM

## 2021-10-20 ENCOUNTER — Encounter: Payer: Self-pay | Admitting: Physical Therapy

## 2021-10-20 ENCOUNTER — Other Ambulatory Visit: Payer: Self-pay

## 2021-10-20 ENCOUNTER — Ambulatory Visit: Payer: Medicare Other | Admitting: Physical Therapy

## 2021-10-20 DIAGNOSIS — M79605 Pain in left leg: Secondary | ICD-10-CM

## 2021-10-20 DIAGNOSIS — R293 Abnormal posture: Secondary | ICD-10-CM

## 2021-10-20 DIAGNOSIS — M6281 Muscle weakness (generalized): Secondary | ICD-10-CM | POA: Diagnosis not present

## 2021-10-20 NOTE — Therapy (Signed)
OUTPATIENT PHYSICAL THERAPY TREATMENT NOTE   Patient Name: Rose Burke MRN: [id number] DOB:09-27-1955, 66 y.o., female Today's Date: 10/20/2021  PCP: Lavone Orn, MD REFERRING PROVIDER: Lavone Orn, MD  END OF SESSION:   PT End of Session - 10/20/21 0921     Visit Number 11    Number of Visits 13    Date for PT Re-Evaluation 11/03/21    Authorization Type MEDICARE PART A AND B; BCBS SUPPLEMENT    PT Start Time 0918    PT Stop Time 0958    PT Time Calculation (min) 40 min    Activity Tolerance Patient tolerated treatment well    Behavior During Therapy [facility] for tasks assessed/performed                 History reviewed. No pertinent past medical history. History reviewed. No pertinent surgical history. There are no problems to display for this patient.   REFERRING DIAG: M79.605 (ICD-10-CM) - Pain in left leg  THERAPY DIAG:  Pain in left leg  Muscle weakness (generalized)  Abnormal posture  Rationale for Evaluation and Treatment Rehabilitation  ONSET DATE:4-5 months    SUBJECTIVE: Patient reports when she went out to the trash can yesterday she did feel some shooting pain in her right foot.  PAIN:  Are you having pain? NPRS scale: 0/10 Pain location: Left lateral hip  Pain description: tender Aggravating factors: Cumulative activity, not a specific movement Relieving factors: Tylenol, walking Bilat knee 3/10, wrose 9/10 steps  PERTINENT HISTORY: High BMI   PRECAUTIONS: None   PATIENT GOALS: Relief of pain. Find out what's going on with my body.     OBJECTIVE: (objective measures completed at initial evaluation unless otherwise dated)  PATIENT SURVEYS:  Modified Oswestry To be assessed    MUSCLE LENGTH: Hamstrings: min tight L and R Thomas test: TBA   POSTURE: marked increased lumbar lordosis   PALPATION: TPP B lateral hips, L>R and to the L superior to the lateral iliac creast   Trunk AROMs:            WNLs for all movements  and pt's pain was not reproduced. With forward flexion, pt's lordotic curve did not fully decrease   LOWER EXTREMITY ROM:                       Grossly WNLs. AROM of the L hip for all movements did not reproduce pt's pain.   LOWER EXTREMITY MMT:                       Grossly 4+ for legs. Weak core.   Lumabr SPECIAL TESTS:  Slump test and SLR were both negative   FUNCTIONAL TESTS:  5 times sit to stand: 10.3 sec s use of hands . WNLs 10/04/21: plank on knees 13 sec best 10/09/21: plank on knees 45 sec best 10/09/21: Bridge 60 sec  10/18/21 Plank on knees 60"   GAIT: Distance walked: 249ft Assistive device utilized: None Level of assistance: Complete Independence Comments: WNLs    TODAY'S TREATMENT: [facility] Adult PT Treatment:                                                DATE: 10/20/21 Therapeutic Exercise: Nustep L6 x 5 minutes while taking subjective Bridge 2 x 10 with 3 sec  hold SLR with hip abduction 2 x 10 each Sidelying hip abduction 2 x 15 each Modified front plank on knees 2 x 3 with 15 sec hold Sit to stand with 15# 2 x 10 Knee extension machine 25# 2 x 15 Knee flexion machine 25# 2 x 15 Forward step-up 8" box 2 x 10 each   [facility] Adult PT Treatment:                                                DATE: 10/18/21 Therapeutic Exercise: Nustep L5 x 5 minutes  Standing ankle PF/foot book stretch 2x30 LAQ 5#  2x10 OMEGA SLR 2x10 3# 60 sec plank on knees  Planks on knees x10 10" Side clam 2x10 each GTB Side hip abduction x 10 each 3# Bridge with Blue Band 2x10 Self Care: Education re: muscle cramps and spasms with stretching when they occur and drinking plenty of fluids with gardening in heat and humidity and exercise for prevention. Education for knee motion after periods of rest with knees in a static position to promote joint lubriaction prior to activity     [facility] Adult PT Treatment:                                                DATE: 10/11/21 Therapeutic  Exercise: Nustep L5 x 5 minutes  Knee ext 10#  2 x 10 OMEGA Knee flexion 25#  2 x 10 OMEGA SLR 2x10 Seated childs pose with ball, forward and lateral 47 sec plank on knees  Side clam x 10 each Side hip abduction x 10 each  Bridge with Blue Band x 10 Supine hamstring stretch bilat Supine piriformis stretch bilat Hip flexor stretch bilat  [facility] Adult PT Treatment:                                                DATE: 10/09/21 Therapeutic Exercise: Nustep L4 x 6 minutes  Seated hamstring stretch 15# STS x 10 standard chair  45 sec plank on knees  60 sec Bridge  Supine hamstring stretch Hip flexor stretch EOM  Piriformis stretch x 2 each  Knee ext 10#  1 x 10 OMEGA Knee flexion 25#  x15 OMEGA Slant board stretch 45 sec    PATIENT EDUCATION:  Education details: HEP Person educated: Patient Education method: Consulting civil engineer, Media planner, Corporate treasurer cues, Verbal cues Education comprehension: verbalized understanding, returned demonstration, verbal cues required, and tactile cues required   HOME EXERCISE PROGRAM: Access Code: [email or web address]    ASSESSMENT: CLINICAL IMPRESSION: Patient tolerated therapy well with no adverse effects. Therapy focused on continued strengthening for hips, core, and LE with good tolerance. Patient did not report any increase in left LE pain this visit. She does continue to report occasional other pains that she would like to follow up with her doctor about but she did not report any increase in those pains with therapy. No changes to HEP this visit. Patient would benefit from continued skilled PT to progress mobility and strength in order to reduce pain and maximize functional ability.    OBJECTIVE IMPAIRMENTS decreased strength,  impaired flexibility, postural dysfunction, obesity, and pain.      GOALS:   SHORT TERM GOALS: = LTGs      LONG TERM GOALS: Target date: 11/03/21    PT will be ind in a final HEP to maintain achieved LOF Baseline: started on  eval Status 10/09/21: performs stretch portion of HEP  Goal status: ONGOING   2.  Pt will report improved L lateral leg with an intensity of 3/10 or less and a decreased frequency of 1 incident per 3 weeks with daily activities  Baseline:7/10, 2x per week  Status 10/09/21: less shooting pain on left, has bilateral posterior thigh pains and cramps with activity and sitting, has tenderness left lateral hip although improved.  10/19/21: 0/10 L hip and posterior/lat thigh Goal status: Improviing   3.  Pt will demonstrate improved core strength by maintaining a plank on knee and standard bridge for 30 sec for improved function with less pain Baseline: plank 13 sec  Status 10/09/21: plank on knees 45 sec, bridge 60 sec. 10/19/21: 60 sec  Goal status: MET   4.  Pt will demonstrate improved hamstring and and hip flexor ROM for improved function with less pain Baseline:  Goal status: ONGOING   PLAN: PT FREQUENCY: 2x/week   PT DURATION: 6 weeks   PLANNED INTERVENTIONS: Therapeutic exercises, Therapeutic activity, Patient/Family education, Joint mobilization, Aquatic Therapy, Dry Needling, Electrical stimulation, Spinal mobilization, Cryotherapy, Moist heat, Taping, Traction, Ultrasound, Ionotophoresis 4mg /ml Dexamethasone, Manual therapy, and Re-evaluation   PLAN FOR NEXT SESSION: check LTGs.; lateral hp strength, hamstring and hip flexor flexibility; development of final HEP     Hilda Blades, PT, DPT, LAT, ATC 10/20/21  9:58 AM Phone: [phone] Fax: [phone]

## 2021-10-23 ENCOUNTER — Encounter: Payer: Self-pay | Admitting: Physical Therapy

## 2021-10-23 ENCOUNTER — Ambulatory Visit: Payer: Medicare Other | Admitting: Physical Therapy

## 2021-10-23 ENCOUNTER — Other Ambulatory Visit: Payer: Self-pay

## 2021-10-23 DIAGNOSIS — M79605 Pain in left leg: Secondary | ICD-10-CM | POA: Diagnosis not present

## 2021-10-23 DIAGNOSIS — M6281 Muscle weakness (generalized): Secondary | ICD-10-CM

## 2021-10-23 DIAGNOSIS — R293 Abnormal posture: Secondary | ICD-10-CM | POA: Diagnosis not present

## 2021-10-23 NOTE — Therapy (Signed)
OUTPATIENT PHYSICAL THERAPY TREATMENT NOTE   Patient Name: Rose Burke MRN: [id number] DOB:01/09/1956, 66 y.o., female Today's Date: 10/23/2021  PCP: Lavone Orn, MD REFERRING PROVIDER: Lavone Orn, MD   END OF SESSION:   PT End of Session - 10/23/21 1448     Visit Number 12    Number of Visits 13    Date for PT Re-Evaluation 11/03/21    Authorization Type MEDICARE PART A AND B; BCBS SUPPLEMENT    PT Start Time 9371    PT Stop Time 1525    PT Time Calculation (min) 40 min    Activity Tolerance Patient tolerated treatment well    Behavior During Therapy [facility] for tasks assessed/performed                  History reviewed. No pertinent past medical history. History reviewed. No pertinent surgical history. There are no problems to display for this patient.   REFERRING DIAG: M79.605 (ICD-10-CM) - Pain in left leg  THERAPY DIAG:  Pain in left leg  Muscle weakness (generalized)  Abnormal posture  Rationale for Evaluation and Treatment Rehabilitation  ONSET DATE:4-5 months    SUBJECTIVE: Patient reports her left foot is bothering her from walking.  PAIN:  Are you having pain? NPRS scale: 0/10 Pain location: Left lateral hip  Pain description: tender Aggravating factors: Cumulative activity, not a specific movement Relieving factors: Tylenol, walking Bilat knee 3/10, wrose 9/10 steps  PERTINENT HISTORY: High BMI   PRECAUTIONS: None   PATIENT GOALS: Relief of pain. Find out what's going on with my body.     OBJECTIVE: (objective measures completed at initial evaluation unless otherwise dated)  PATIENT SURVEYS:  Modified Oswestry To be assessed    MUSCLE LENGTH: Hamstrings: min tight L and R Thomas test: TBA   POSTURE: marked increased lumbar lordosis   PALPATION: TPP B lateral hips, L>R and to the L superior to the lateral iliac creast   Trunk AROMs:            WNLs for all movements and pt's pain was not reproduced. With forward  flexion, pt's lordotic curve did not fully decrease   LOWER EXTREMITY ROM:                       Grossly WNLs. AROM of the L hip for all movements did not reproduce pt's pain.   LOWER EXTREMITY MMT:                       Grossly 4+ for legs. Weak core.   Lumabr SPECIAL TESTS:  Slump test and SLR were both negative   FUNCTIONAL TESTS:  5 times sit to stand: 10.3 sec s use of hands . WNLs 10/04/21: plank on knees 13 sec best 10/09/21: plank on knees 45 sec best 10/09/21: Bridge 60 sec  10/18/21 Plank on knees 60"   GAIT: Distance walked: 275ft Assistive device utilized: None Level of assistance: Complete Independence Comments: WNLs    TODAY'S TREATMENT: [facility] Adult PT Treatment:                                                DATE: 10/23/21 Therapeutic Exercise: Nustep L6 x 5 with UE/LE minutes while taking subjective Slant board calf stretch 3 x 30 second Bridge 2 x 10 with 3  sec hold Sidelying hip abduction 2 x 15 each Modified front plank on knees 2 x 3 with 15 sec hold Sit to stand with 15# 2 x 10 Knee extension machine 20# 2 x 15 Knee flexion machine 20# 2 x 15 Forward step-up 8" box 2 x 10 each   [facility] Adult PT Treatment:                                                DATE: 10/20/21 Therapeutic Exercise: Nustep L6 x 5 minutes while taking subjective Bridge 2 x 10 with 3 sec hold SLR with hip abduction 2 x 10 each Sidelying hip abduction 2 x 15 each Modified front plank on knees 2 x 3 with 15 sec hold Sit to stand with 15# 2 x 10 Knee extension machine 25# 2 x 15 Knee flexion machine 25# 2 x 15 Forward step-up 8" box 2 x 10 each  [facility] Adult PT Treatment:                                                DATE: 10/18/21 Therapeutic Exercise: Nustep L5 x 5 minutes  Standing ankle PF/foot book stretch 2x30 LAQ 5#  2x10 OMEGA SLR 2x10 3# 60 sec plank on knees  Planks on knees x10 10" Side clam 2x10 each GTB Side hip abduction x 10 each 3# Bridge with Blue Band 2x10 Self  Care: Education re: muscle cramps and spasms with stretching when they occur and drinking plenty of fluids with gardening in heat and humidity and exercise for prevention. Education for knee motion after periods of rest with knees in a static position to promote joint lubriaction prior to activity    PATIENT EDUCATION:  Education details: HEP Person educated: Patient Education method: Consulting civil engineer, Demonstration, Corporate treasurer cues, Verbal cues Education comprehension: verbalized understanding, returned demonstration, verbal cues required, and tactile cues required   HOME EXERCISE PROGRAM: Access Code: [email or web address]    ASSESSMENT: CLINICAL IMPRESSION: Patient tolerated therapy well with no adverse effects. Therapy continues to focus on gross strength progressions with good tolerance. She did report left hip cramping with exercises but this resolved once she completed the exercise. Incorporated some ankle stretching due to her report of left foot pain at beginning of therapy. No changes to HEP this visit. Will plan discharge next visit. Patient would benefit from continued skilled PT to progress mobility and strength in order to reduce pain and maximize functional ability.    OBJECTIVE IMPAIRMENTS decreased strength, impaired flexibility, postural dysfunction, obesity, and pain.      GOALS:   SHORT TERM GOALS: = LTGs      LONG TERM GOALS: Target date: 11/03/21    PT will be ind in a final HEP to maintain achieved LOF Baseline: started on eval Status 10/09/21: performs stretch portion of HEP  Goal status: ONGOING   2.  Pt will report improved L lateral leg with an intensity of 3/10 or less and a decreased frequency of 1 incident per 3 weeks with daily activities  Baseline:7/10, 2x per week  Status 10/09/21: less shooting pain on left, has bilateral posterior thigh pains and cramps with activity and sitting, has tenderness left lateral hip although improved.  10/19/21: 0/10 L hip  and posterior/lat  thigh Goal status: Improviing   3.  Pt will demonstrate improved core strength by maintaining a plank on knee and standard bridge for 30 sec for improved function with less pain Baseline: plank 13 sec  Status 10/09/21: plank on knees 45 sec, bridge 60 sec. 10/19/21: 60 sec  Goal status: MET   4.  Pt will demonstrate improved hamstring and and hip flexor ROM for improved function with less pain Baseline:  Goal status: ONGOING    PLAN: PT FREQUENCY: 2x/week   PT DURATION: 6 weeks   PLANNED INTERVENTIONS: Therapeutic exercises, Therapeutic activity, Patient/Family education, Joint mobilization, Aquatic Therapy, Dry Needling, Electrical stimulation, Spinal mobilization, Cryotherapy, Moist heat, Taping, Traction, Ultrasound, Ionotophoresis 4mg /ml Dexamethasone, Manual therapy, and Re-evaluation   PLAN FOR NEXT SESSION: check LTGs.; lateral hp strength, hamstring and hip flexor flexibility; development of final HEP     Hilda Blades, PT, DPT, LAT, ATC 10/23/21  3:28 PM Phone: [phone] Fax: [phone]

## 2021-10-25 ENCOUNTER — Ambulatory Visit: Payer: Medicare Other

## 2021-10-25 DIAGNOSIS — M6281 Muscle weakness (generalized): Secondary | ICD-10-CM

## 2021-10-25 DIAGNOSIS — M79605 Pain in left leg: Secondary | ICD-10-CM | POA: Diagnosis not present

## 2021-10-25 DIAGNOSIS — R293 Abnormal posture: Secondary | ICD-10-CM | POA: Diagnosis not present

## 2021-10-25 NOTE — Therapy (Signed)
OUTPATIENT PHYSICAL THERAPY TREATMENT NOTE/Discharge   Patient Name: Rose Burke MRN: [id number] DOB:Aug 29, 1955, 66 y.o., female Today's Date: 10/25/2021  PCP: Lavone Orn, MD REFERRING PROVIDER: Lavone Orn, MD   END OF SESSION:   PT End of Session - 10/25/21 1341     Visit Number 13    Number of Visits 13    Date for PT Re-Evaluation 11/03/21    Authorization Type MEDICARE PART A AND B; BCBS SUPPLEMENT    PT Start Time 4034    PT Stop Time 1415    PT Time Calculation (min) 41 min    Activity Tolerance Patient tolerated treatment well    Behavior During Therapy [facility] for tasks assessed/performed                   History reviewed. No pertinent past medical history. History reviewed. No pertinent surgical history. There are no problems to display for this patient.   REFERRING DIAG: M79.605 (ICD-10-CM) - Pain in left leg  THERAPY DIAG:  Pain in left leg  Muscle weakness (generalized)  Abnormal posture  Rationale for Evaluation and Treatment Rehabilitation  ONSET DATE:4-5 months    SUBJECTIVE: Pt reports she has no significant concerns.  She continues to report the L LE shooting pain has resolved and that her knees and feet can bother her intermittently. Pt reports she is planning to join a gym with and ex group.  PAIN:  Are you having pain? NPRS scale: 0/10 Pain location: Left lateral hip  Pain description: tender Aggravating factors: Cumulative activity, not a specific movement Relieving factors: Tylenol, walking Bilat knee 3/10, wrose 9/10 steps  PERTINENT HISTORY: High BMI   PRECAUTIONS: None   PATIENT GOALS: Relief of pain. Find out what's going on with my body.     OBJECTIVE: (objective measures completed at initial evaluation unless otherwise dated)  PATIENT SURVEYS:  Modified Oswestry To be assessed    MUSCLE LENGTH: Hamstrings: min tight L and R Thomas test: TBA   POSTURE: marked increased lumbar lordosis    PALPATION: TPP B lateral hips, L>R and to the L superior to the lateral iliac creast   Trunk AROMs:            WNLs for all movements and pt's pain was not reproduced. With forward flexion, pt's lordotic curve did not fully decrease   LOWER EXTREMITY ROM:                       Grossly WNLs. AROM of the L hip for all movements did not reproduce pt's pain.   LOWER EXTREMITY MMT:                       Grossly 4+ for legs. Weak core.   Lumabr SPECIAL TESTS:  Slump test and SLR were both negative   FUNCTIONAL TESTS:  5 times sit to stand: 10.3 sec s use of hands . WNLs 10/04/21: plank on knees 13 sec best 10/09/21: plank on knees 45 sec best 10/09/21: Bridge 60 sec  10/18/21 Plank on knees 60"   GAIT: Distance walked: 235ft Assistive device utilized: None Level of assistance: Complete Independence Comments: WNLs    TODAY'S TREATMENT: [facility] Treatment:  DATE: 10/25/21 Therapeutic Exercise: Nustep L6 x 5 with UE/LE minutes while taking subjective Gastroc stretch x2 30" Piriformis stretch x2 30" Figure 4 x2 30" Bridging x10 5 sec GTB around knees SL clams x10 GTB SL hip abd x 10 Modified front plank on knees 2 x 3 with 15 sec hold STS 2x 10 c 15#   [facility] Adult PT Treatment:                                                DATE: 10/23/21 Therapeutic Exercise: Nustep L6 x 5 with UE/LE minutes while taking subjective Slant board calf stretch 3 x 30 second Bridge 2 x 10 with 3 sec hold Sidelying hip abduction 2 x 15 each Modified front plank on knees 2 x 3 with 15 sec hold Sit to stand with 15# 2 x 10 Knee extension machine 20# 2 x 15 Knee flexion machine 20# 2 x 15 Forward step-up 8" box 2 x 10 each   [facility] Adult PT Treatment:                                                DATE: 10/20/21 Therapeutic Exercise: Nustep L6 x 5 minutes while taking subjective Bridge 2 x 10 with 3 sec hold SLR with hip abduction 2 x 10  each Sidelying hip abduction 2 x 15 each Modified front plank on knees 2 x 3 with 15 sec hold Sit to stand with 15# 2 x 10 Knee extension machine 25# 2 x 15 Knee flexion machine 25# 2 x 15 Forward step-up 8" box 2 x 10 each   PATIENT EDUCATION:  Education details: HEP Person educated: Patient Education method: Explanation, Demonstration, Corporate treasurer cues, Verbal cues Education comprehension: verbalized understanding, returned demonstration, verbal cues required, and tactile cues required   HOME EXERCISE PROGRAM: Access Code: [email or web address] URL: [email or web address] Date: 10/25/2021 Prepared by: Gar Ponto  Exercises - Seated Flexion Stretch with Swiss Ball  - 2-4 x daily - 7 x weekly - 1 sets - 2 reps - 20 hold - Seated Thoracic Flexion and Rotation with Swiss Ball  - 2-4 x daily - 7 x weekly - 1 sets - 2 reps - 20 hold - Supine 90/90 Abdominal Bracing  - 1-2 x daily - 7 x weekly - 1 sets - 5 reps - 10 hold - Supine Posterior Pelvic Tilt  - 1-2 x daily - 7 x weekly - 1 sets - 10 reps - 3 hold - Active Straight Leg Raise with Quad Set  - 1-2 x daily - 7 x weekly - 1 sets - 10 reps - 3 hold - Supine Bridge  - 1-2 x daily - 7 x weekly - 1 sets - 10 reps - 3 hold - Hooklying Clamshell with Resistance  - 1-2 x daily - 7 x weekly - 1 sets - 10 reps - 3 hold - Supine Hip Adduction Isometric with Ball  - 1-2 x daily - 7 x weekly - 1 sets - 10 reps - 3 hold - Sit to Stand Without Arm Support  - 1-2 x daily - 7 x weekly - 1 sets - 10 reps - 3 hold - Seated Hamstring Stretch  - 1-2 x daily -  7 x weekly - 1 sets - 3 reps - 20 hold - Prone Quadriceps Stretch with Strap  - 1-2 x daily - 7 x weekly - 1 sets - 3 reps - 20 hold - Supine Piriformis Stretch with Foot on Ground  - 1-2 x daily - 7 x weekly - 1 sets - 3 reps - 20 hold - Sidelying Hip Abduction  - 1 x daily - 7 x weekly - 2-3 sets - 10 reps - 3 hold - Clamshell with Resistance  - 1 x daily - 7 x weekly - 2-3 sets - 10 reps - 3  hold    ASSESSMENT: CLINICAL IMPRESSION: Pt completed her course of PT today. Pain, core strength, and hamstring and hip flexor flexibility are all improved meeting LTGs. Pt is Ind in a HEP to maintain achieved LOF. Pt is in agreement with DC from PT at this time.  OBJECTIVE IMPAIRMENTS decreased strength, impaired flexibility, postural dysfunction, obesity, and pain.      GOALS:   SHORT TERM GOALS: = LTGs      LONG TERM GOALS: Target date: 11/03/21    PT will be ind in a final HEP to maintain achieved LOF Baseline: started on eval Status 10/09/21: performs stretch portion of HEP  Goal status: MET   2.  Pt will report improved L lateral leg with an intensity of 3/10 or less and a decreased frequency of 1 incident per 3 weeks with daily activities  Baseline:7/10, 2x per week  Status 10/09/21: less shooting pain on left, has bilateral posterior thigh pains and cramps with activity and sitting, has tenderness left lateral hip although improved.  10/19/21: 0/10 L hip and posterior/lat thigh Goal status: MET   3.  Pt will demonstrate improved core strength by maintaining a plank on knee and standard bridge for 30 sec for improved function with less pain Baseline: plank 13 sec  Status 10/09/21: plank on knees 45 sec, bridge 60 sec. 10/19/21: 60 sec  Goal status: MET   4.  Pt will demonstrate improved hamstring and and hip flexor ROM for improved function with less pain Baseline:  Goal status: MET    PLAN: PT FREQUENCY: 2x/week   PT DURATION: 6 weeks   PLANNED INTERVENTIONS: Therapeutic exercises, Therapeutic activity, Patient/Family education, Joint mobilization, Aquatic Therapy, Dry Needling, Electrical stimulation, Spinal mobilization, Cryotherapy, Moist heat, Taping, Traction, Ultrasound, Ionotophoresis 4mg /ml Dexamethasone, Manual therapy, and Re-evaluation   PLAN FOR NEXT SESSION: check LTGs.; lateral hp strength, hamstring and hip flexor flexibility; development of final HEP    PHYSICAL THERAPY DISCHARGE SUMMARY  Visits from Start of Care: 13  Current functional level related to goals / functional outcomes: See clinical impression and goals   Remaining deficits: See clinical impression and goals   Education / Equipment: HEP   Patient agrees to discharge. Patient goals were met. Patient is being discharged due to being pleased with the current functional level.     Malissie Musgrave MS, PT 10/25/21 10:38 PM

## 2021-10-30 ENCOUNTER — Encounter: Payer: Medicare Other | Admitting: Physical Therapy

## 2021-11-02 DIAGNOSIS — Z23 Encounter for immunization: Secondary | ICD-10-CM | POA: Diagnosis not present

## 2021-12-29 DIAGNOSIS — Z23 Encounter for immunization: Secondary | ICD-10-CM | POA: Diagnosis not present

## 2022-02-01 DIAGNOSIS — Z1231 Encounter for screening mammogram for malignant neoplasm of breast: Secondary | ICD-10-CM | POA: Diagnosis not present

## 2022-02-06 DIAGNOSIS — Z01419 Encounter for gynecological examination (general) (routine) without abnormal findings: Secondary | ICD-10-CM | POA: Diagnosis not present

## 2022-05-23 DIAGNOSIS — Z23 Encounter for immunization: Secondary | ICD-10-CM | POA: Diagnosis not present

## 2022-08-31 ENCOUNTER — Other Ambulatory Visit: Payer: Self-pay | Admitting: Physician Assistant

## 2022-08-31 ENCOUNTER — Ambulatory Visit: Payer: Medicare Other

## 2022-08-31 ENCOUNTER — Ambulatory Visit
Admission: RE | Admit: 2022-08-31 | Discharge: 2022-08-31 | Disposition: A | Discharge status: Home / Self Care | Payer: Medicare Other | Source: Ambulatory Visit | Attending: Physician Assistant | Admitting: Physician Assistant

## 2022-08-31 DIAGNOSIS — M19071 Primary osteoarthritis, right ankle and foot: Secondary | ICD-10-CM | POA: Diagnosis not present

## 2022-08-31 DIAGNOSIS — Z0000 Encounter for general adult medical examination without abnormal findings: Secondary | ICD-10-CM | POA: Diagnosis not present

## 2022-08-31 DIAGNOSIS — Z1331 Encounter for screening for depression: Secondary | ICD-10-CM | POA: Diagnosis not present

## 2022-08-31 DIAGNOSIS — J4520 Mild intermittent asthma, uncomplicated: Secondary | ICD-10-CM | POA: Diagnosis not present

## 2022-08-31 DIAGNOSIS — K219 Gastro-esophageal reflux disease without esophagitis: Secondary | ICD-10-CM | POA: Diagnosis not present

## 2022-08-31 DIAGNOSIS — K589 Irritable bowel syndrome without diarrhea: Secondary | ICD-10-CM | POA: Diagnosis not present

## 2022-08-31 DIAGNOSIS — D649 Anemia, unspecified: Secondary | ICD-10-CM | POA: Diagnosis not present

## 2022-08-31 DIAGNOSIS — M79604 Pain in right leg: Secondary | ICD-10-CM | POA: Diagnosis not present

## 2022-08-31 DIAGNOSIS — I10 Essential (primary) hypertension: Secondary | ICD-10-CM | POA: Diagnosis not present

## 2022-09-07 DIAGNOSIS — L239 Allergic contact dermatitis, unspecified cause: Secondary | ICD-10-CM | POA: Diagnosis not present

## 2022-09-10 ENCOUNTER — Other Ambulatory Visit: Payer: Self-pay | Admitting: Physician Assistant

## 2022-09-10 DIAGNOSIS — G8929 Other chronic pain: Secondary | ICD-10-CM

## 2022-09-16 ENCOUNTER — Ambulatory Visit
Admission: RE | Admit: 2022-09-16 | Discharge: 2022-09-16 | Disposition: A | Discharge status: Home / Self Care | Payer: Medicare Other | Source: Ambulatory Visit | Attending: Physician Assistant | Admitting: Physician Assistant

## 2022-09-16 DIAGNOSIS — G8929 Other chronic pain: Secondary | ICD-10-CM

## 2022-09-16 DIAGNOSIS — M47816 Spondylosis without myelopathy or radiculopathy, lumbar region: Secondary | ICD-10-CM | POA: Diagnosis not present

## 2022-09-16 DIAGNOSIS — M5126 Other intervertebral disc displacement, lumbar region: Secondary | ICD-10-CM | POA: Diagnosis not present

## 2022-09-20 ENCOUNTER — Encounter: Payer: Self-pay | Admitting: Orthopedic Surgery

## 2022-09-20 ENCOUNTER — Ambulatory Visit (INDEPENDENT_AMBULATORY_CARE_PROVIDER_SITE_OTHER): Payer: Medicare Other | Admitting: Orthopedic Surgery

## 2022-09-20 DIAGNOSIS — M2021 Hallux rigidus, right foot: Secondary | ICD-10-CM

## 2022-09-20 NOTE — Progress Notes (Signed)
Office Visit Note   Patient: Rose Burke           Date of Birth: July 22, 1955           MRN: [id number] Visit Date: 09/20/2022              Requested by: Delma Officer, PA [address] [place],  [place] [address] PCP: Kirby Funk, MD (Inactive)  Chief Complaint  Patient presents with   Right Foot - Pain     Also has radicular pain from her lower back and she is status post an MRI scan.  She states she also has arthritic changes in multiple joints. HPI: Patient is a 67 year old woman who is seen for initial evaluation for hallux rigidus right great toe.  Patient has pain with ambulation she has had radiographs obtained in May.  Patient states she  Assessment & Plan: Visit Diagnoses:  1. Hallux rigidus, right foot     Plan: Recommended a stiff soled sneaker to unload pressure from the great toe.  She could also use a carbon orthotic to help unload the great toe.  Discussed that if conservative treatment does not work that a fusion of the MTP joint is an option.  Patient states she is currently caring for her parents and could not take the time for recovery from surgery at this time.  Patient also complains of cramping in her legs at the end of the day.  Recommended electrolytes such as coconut water to help with the cramping.  Follow-Up Instructions: No follow-ups on file.   Ortho Exam  Patient is alert, oriented, no adenopathy, well-dressed, normal affect, normal respiratory effort. Examination patient has a good dorsalis pedis and posterior tibial pulse she has dorsiflexion just past neutral of the ankle.  She has large osteophytic bone spurs around the MTP joint of the right great toe.  There is essentially no range of motion of the great toe MTP joint.  Radiograph shows complete collapse with dorsal osteophytic bone spurs of the MTP joint.  The remainder of the joints in the foot are congruent without collapse.  Imaging: No results found. No images are  attached to the encounter.  Labs: No results found for: "HGBA1C", "ESRSEDRATE", "CRP", "LABURIC", "REPTSTATUS", "GRAMSTAIN", "CULT", "LABORGA"   No results found for: "ALBUMIN", "PREALBUMIN", "CBC"  No results found for: "MG" No results found for: "VD25OH"  No results found for: "PREALBUMIN"     No data to display           There is no height or weight on file to calculate BMI.  Orders:  No orders of the defined types were placed in this encounter.  No orders of the defined types were placed in this encounter.    Procedures: No procedures performed  Clinical Data: No additional findings.  ROS:  All other systems negative, except as noted in the HPI. Review of Systems  Objective: Vital Signs: There were no vitals taken for this visit.  Specialty Comments:  No specialty comments available.  PMFS History: There are no problems to display for this patient.  History reviewed. No pertinent past medical history.  History reviewed. No pertinent family history.  History reviewed. No pertinent surgical history. Social History   Occupational History   Not on file  Tobacco Use   Smoking status: Not on file   Smokeless tobacco: Not on file  Substance and Sexual Activity   Alcohol use: Not on file   Drug use: Not  on file   Sexual activity: Not on file

## 2022-10-01 DIAGNOSIS — H43813 Vitreous degeneration, bilateral: Secondary | ICD-10-CM | POA: Diagnosis not present

## 2022-10-19 DIAGNOSIS — M48062 Spinal stenosis, lumbar region with neurogenic claudication: Secondary | ICD-10-CM | POA: Diagnosis not present

## 2022-10-19 DIAGNOSIS — M4696 Unspecified inflammatory spondylopathy, lumbar region: Secondary | ICD-10-CM | POA: Diagnosis not present

## 2022-10-23 DIAGNOSIS — M5416 Radiculopathy, lumbar region: Secondary | ICD-10-CM | POA: Diagnosis not present

## 2022-10-23 DIAGNOSIS — M25551 Pain in right hip: Secondary | ICD-10-CM | POA: Diagnosis not present

## 2022-10-24 DIAGNOSIS — M2021 Hallux rigidus, right foot: Secondary | ICD-10-CM | POA: Diagnosis not present

## 2022-11-06 DIAGNOSIS — M898X7 Other specified disorders of bone, ankle and foot: Secondary | ICD-10-CM | POA: Diagnosis not present

## 2022-11-06 DIAGNOSIS — M948X7 Other specified disorders of cartilage, ankle and foot: Secondary | ICD-10-CM | POA: Diagnosis not present

## 2022-11-06 DIAGNOSIS — M2021 Hallux rigidus, right foot: Secondary | ICD-10-CM | POA: Diagnosis not present

## 2022-11-06 DIAGNOSIS — G8918 Other acute postprocedural pain: Secondary | ICD-10-CM | POA: Diagnosis not present

## 2022-11-21 DIAGNOSIS — M2021 Hallux rigidus, right foot: Secondary | ICD-10-CM | POA: Diagnosis not present

## 2022-11-28 DIAGNOSIS — M5416 Radiculopathy, lumbar region: Secondary | ICD-10-CM | POA: Diagnosis not present

## 2022-12-08 DIAGNOSIS — S61441A Puncture wound with foreign body of right hand, initial encounter: Secondary | ICD-10-CM | POA: Diagnosis not present

## 2022-12-08 DIAGNOSIS — T63441A Toxic effect of venom of bees, accidental (unintentional), initial encounter: Secondary | ICD-10-CM | POA: Diagnosis not present

## 2022-12-11 DIAGNOSIS — Z23 Encounter for immunization: Secondary | ICD-10-CM | POA: Diagnosis not present

## 2022-12-17 DIAGNOSIS — M5416 Radiculopathy, lumbar region: Secondary | ICD-10-CM | POA: Diagnosis not present

## 2022-12-21 DIAGNOSIS — M2021 Hallux rigidus, right foot: Secondary | ICD-10-CM | POA: Diagnosis not present

## 2023-02-01 DIAGNOSIS — M2021 Hallux rigidus, right foot: Secondary | ICD-10-CM | POA: Diagnosis not present

## 2023-02-04 DIAGNOSIS — Z1231 Encounter for screening mammogram for malignant neoplasm of breast: Secondary | ICD-10-CM | POA: Diagnosis not present

## 2023-02-11 DIAGNOSIS — M5416 Radiculopathy, lumbar region: Secondary | ICD-10-CM | POA: Diagnosis not present

## 2023-02-12 DIAGNOSIS — R32 Unspecified urinary incontinence: Secondary | ICD-10-CM | POA: Diagnosis not present

## 2023-02-12 DIAGNOSIS — Z01419 Encounter for gynecological examination (general) (routine) without abnormal findings: Secondary | ICD-10-CM | POA: Diagnosis not present

## 2023-02-19 DIAGNOSIS — M5416 Radiculopathy, lumbar region: Secondary | ICD-10-CM | POA: Diagnosis not present

## 2023-03-29 DIAGNOSIS — M79671 Pain in right foot: Secondary | ICD-10-CM | POA: Diagnosis not present

## 2023-04-23 DIAGNOSIS — M5416 Radiculopathy, lumbar region: Secondary | ICD-10-CM | POA: Diagnosis not present

## 2023-05-08 DIAGNOSIS — M79671 Pain in right foot: Secondary | ICD-10-CM | POA: Diagnosis not present

## 2023-06-06 DIAGNOSIS — M79604 Pain in right leg: Secondary | ICD-10-CM | POA: Diagnosis not present

## 2023-06-06 DIAGNOSIS — M79605 Pain in left leg: Secondary | ICD-10-CM | POA: Diagnosis not present

## 2023-06-06 DIAGNOSIS — M5416 Radiculopathy, lumbar region: Secondary | ICD-10-CM | POA: Diagnosis not present

## 2023-07-09 DIAGNOSIS — T8484XA Pain due to internal orthopedic prosthetic devices, implants and grafts, initial encounter: Secondary | ICD-10-CM | POA: Diagnosis not present

## 2023-07-09 DIAGNOSIS — M85871 Other specified disorders of bone density and structure, right ankle and foot: Secondary | ICD-10-CM | POA: Diagnosis not present

## 2023-09-03 DIAGNOSIS — Z23 Encounter for immunization: Secondary | ICD-10-CM | POA: Diagnosis not present

## 2023-09-03 DIAGNOSIS — K219 Gastro-esophageal reflux disease without esophagitis: Secondary | ICD-10-CM | POA: Diagnosis not present

## 2023-09-03 DIAGNOSIS — D649 Anemia, unspecified: Secondary | ICD-10-CM | POA: Diagnosis not present

## 2023-09-03 DIAGNOSIS — J3089 Other allergic rhinitis: Secondary | ICD-10-CM | POA: Diagnosis not present

## 2023-09-03 DIAGNOSIS — Z0000 Encounter for general adult medical examination without abnormal findings: Secondary | ICD-10-CM | POA: Diagnosis not present

## 2023-09-03 DIAGNOSIS — I10 Essential (primary) hypertension: Secondary | ICD-10-CM | POA: Diagnosis not present

## 2023-09-03 DIAGNOSIS — J4520 Mild intermittent asthma, uncomplicated: Secondary | ICD-10-CM | POA: Diagnosis not present

## 2023-09-03 DIAGNOSIS — M48061 Spinal stenosis, lumbar region without neurogenic claudication: Secondary | ICD-10-CM | POA: Diagnosis not present

## 2023-09-03 DIAGNOSIS — M19071 Primary osteoarthritis, right ankle and foot: Secondary | ICD-10-CM | POA: Diagnosis not present

## 2023-09-10 DIAGNOSIS — M5416 Radiculopathy, lumbar region: Secondary | ICD-10-CM | POA: Diagnosis not present

## 2023-09-11 ENCOUNTER — Ambulatory Visit (HOSPITAL_COMMUNITY)
Admission: RE | Admit: 2023-09-11 | Discharge: 2023-09-11 | Disposition: A | Discharge status: Home / Self Care | Source: Ambulatory Visit | Attending: Vascular Surgery | Admitting: Vascular Surgery

## 2023-09-11 ENCOUNTER — Other Ambulatory Visit (HOSPITAL_COMMUNITY): Payer: Self-pay | Admitting: Physical Medicine and Rehabilitation

## 2023-09-11 DIAGNOSIS — M25471 Effusion, right ankle: Secondary | ICD-10-CM | POA: Diagnosis not present

## 2023-09-11 DIAGNOSIS — M25472 Effusion, left ankle: Secondary | ICD-10-CM | POA: Diagnosis not present

## 2023-09-11 DIAGNOSIS — M25475 Effusion, left foot: Secondary | ICD-10-CM | POA: Diagnosis not present

## 2023-09-11 DIAGNOSIS — M25474 Effusion, right foot: Secondary | ICD-10-CM

## 2023-10-02 DIAGNOSIS — S20462A Insect bite (nonvenomous) of left back wall of thorax, initial encounter: Secondary | ICD-10-CM | POA: Diagnosis not present

## 2023-10-02 DIAGNOSIS — W57XXXA Bitten or stung by nonvenomous insect and other nonvenomous arthropods, initial encounter: Secondary | ICD-10-CM | POA: Diagnosis not present

## 2023-10-02 DIAGNOSIS — R21 Rash and other nonspecific skin eruption: Secondary | ICD-10-CM | POA: Diagnosis not present

## 2023-10-10 DIAGNOSIS — M5416 Radiculopathy, lumbar region: Secondary | ICD-10-CM | POA: Diagnosis not present

## 2023-10-14 DIAGNOSIS — M7741 Metatarsalgia, right foot: Secondary | ICD-10-CM | POA: Diagnosis not present

## 2023-10-21 ENCOUNTER — Other Ambulatory Visit: Payer: Self-pay | Admitting: Orthopaedic Surgery

## 2023-10-21 DIAGNOSIS — M7741 Metatarsalgia, right foot: Secondary | ICD-10-CM

## 2023-10-22 IMAGING — CT CT CARDIAC CORONARY ARTERY CALCIUM SCORE
1 of 3 series · 9 of 20 positions shown, 12 images · non-contrast
Comparison: None Available.

CLINICAL DATA: 66-year-old white female with essential
hypertension.

EXAM:
CT CARDIAC CORONARY ARTERY CALCIUM SCORE
TECHNIQUE: Non-contrast imaging through the heart was performed using
prospective ECG gating. Image post processing was performed on an
independent workstation, allowing for quantitative analysis of the
heart and coronary arteries. Note that this exam targets the heart
and the chest was not imaged in its entirety.

[Series 3: thin sfov · axial · 0.35mm/px · z∈[-161,-24]mm · 9 of 170 slices shown, 12 images]
[im 17/170  vessel]
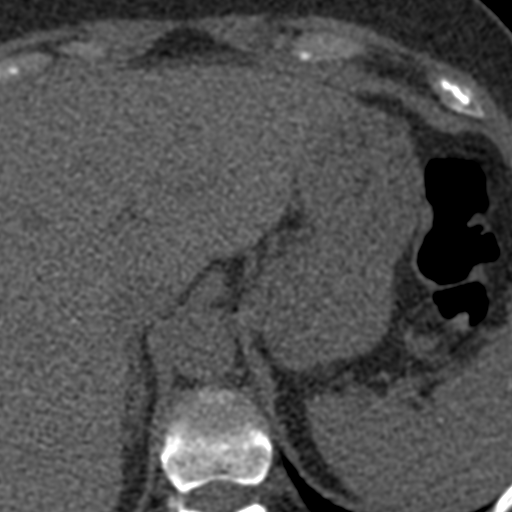
[im 17/170  lung]
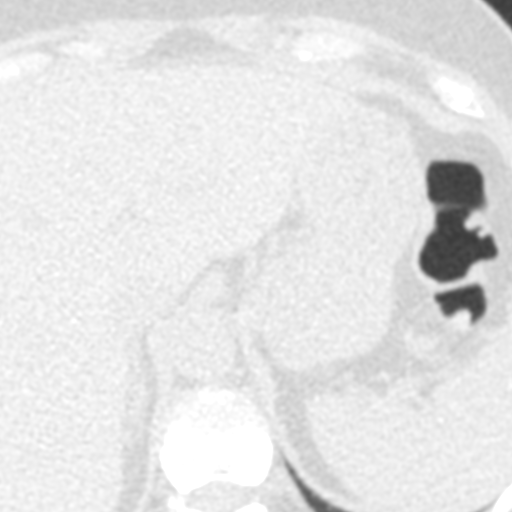
[im 34/170  vessel]
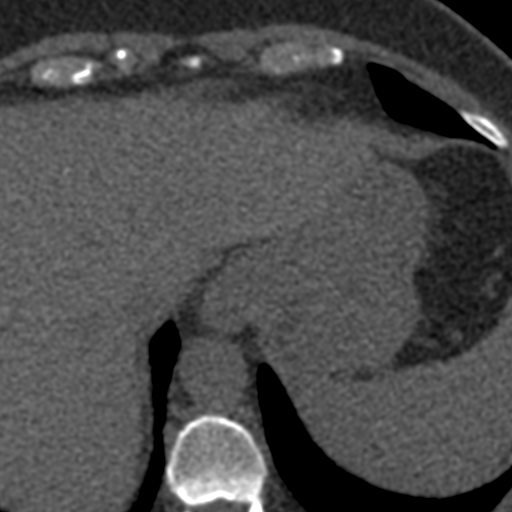
[im 51/170  vessel]
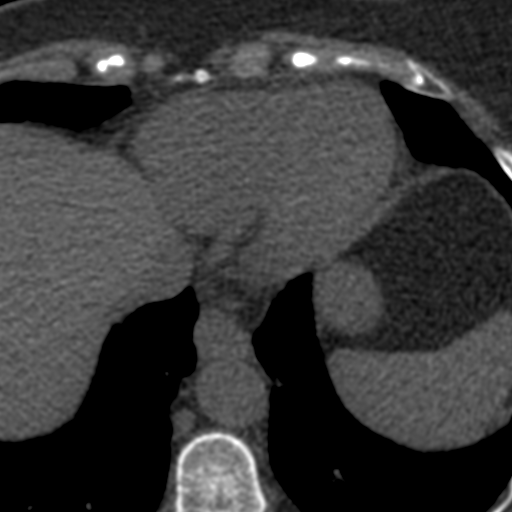
[im 68/170  vessel]
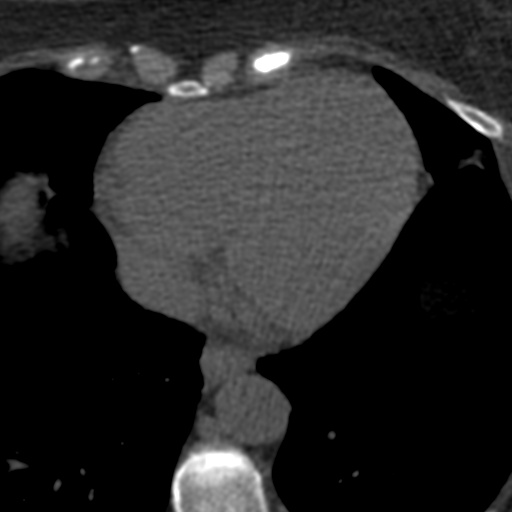
[im 85/170  vessel]
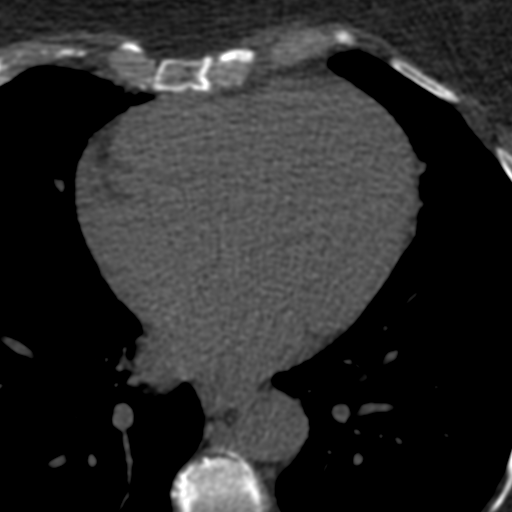
[im 85/170  lung]
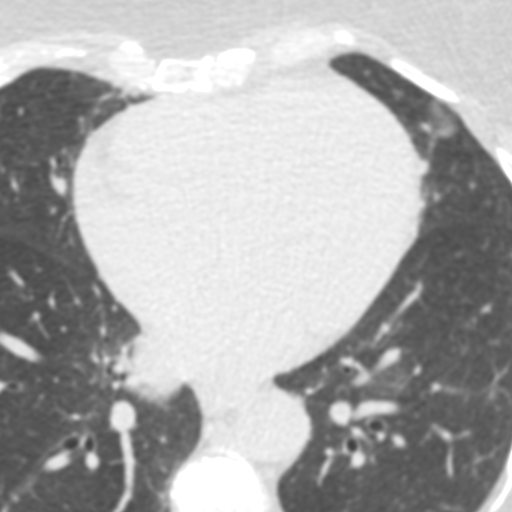
[im 102/170  vessel]
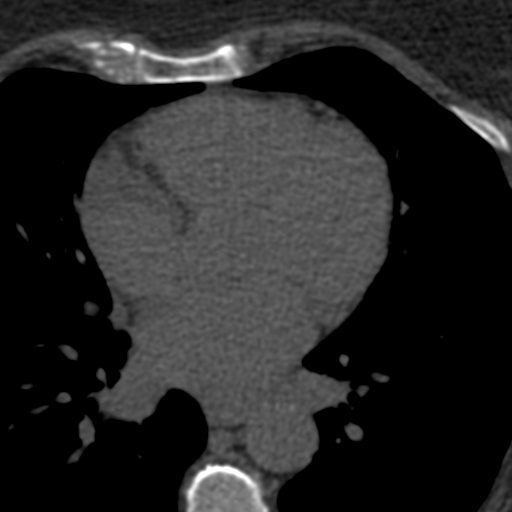
[im 119/170  vessel]
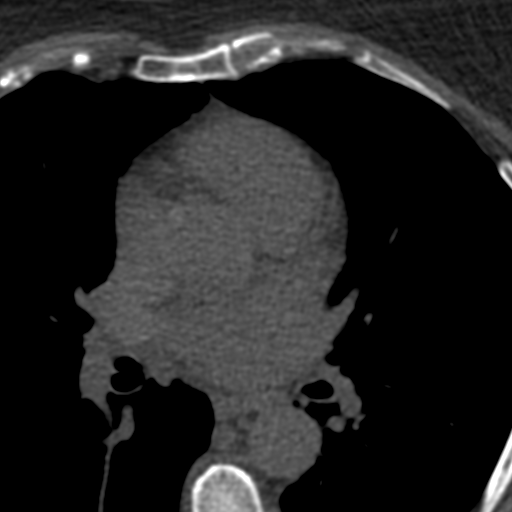
[im 136/170  vessel]
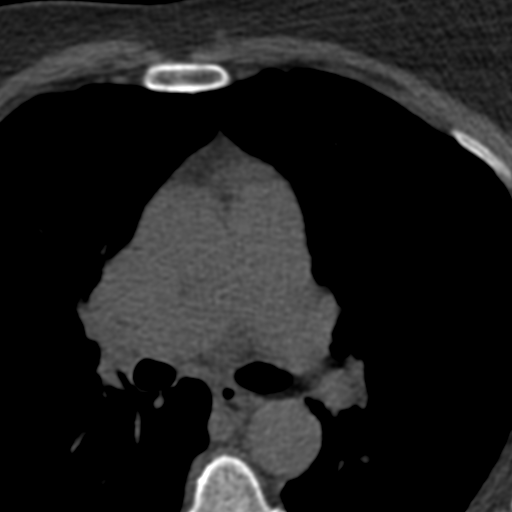
[im 153/170  vessel]
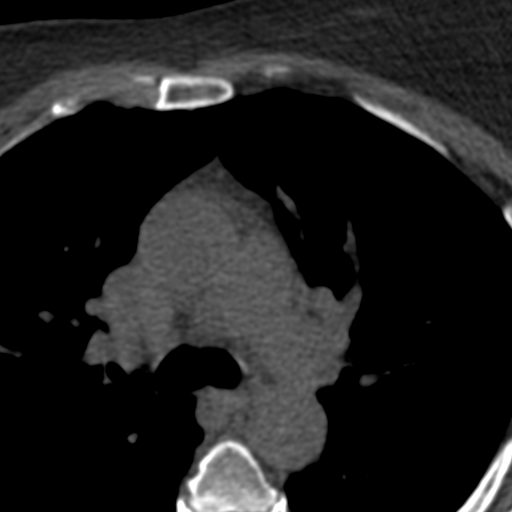
[im 153/170  lung]
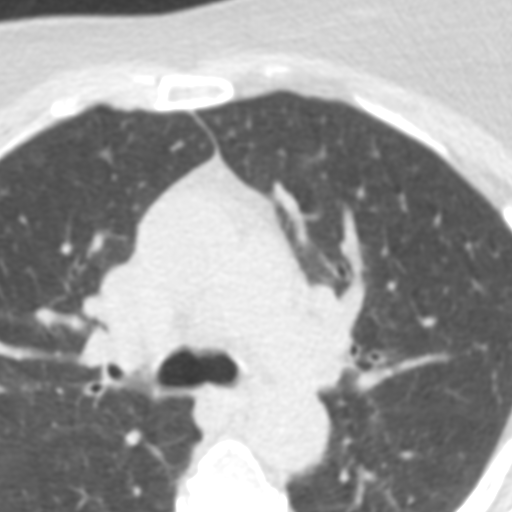

[9 of 20 positions shown; findings below may reference images not displayed]

FINDINGS: CORONARY CALCIUM SCORES:

Left Main: 0

LAD: 0

LCx: 0

RCA: 0

Total Agatston Score: 0

[HOSPITAL] percentile: 0

AORTA MEASUREMENTS:

Ascending Aorta: 28 mm

Descending Aorta: 27 mm

OTHER FINDINGS:

Heart size is normal. Visualized mediastinal structures are normal.
Images of the upper abdomen are unremarkable. No pleural effusions.
No airspace disease or lung consolidation. Small lung densities
probably represent atelectasis. There is a small cystic structure in
the left upper lobe on sequence 5 image 18 that measures
approximately 1.8 cm. No acute bone abnormality.
IMPRESSION: 1. Coronary calcium score is 0.

## 2023-10-23 ENCOUNTER — Ambulatory Visit
Admission: RE | Admit: 2023-10-23 | Discharge: 2023-10-23 | Disposition: A | Discharge status: Home / Self Care | Source: Ambulatory Visit | Attending: Orthopaedic Surgery | Admitting: Orthopaedic Surgery

## 2023-10-23 DIAGNOSIS — M19071 Primary osteoarthritis, right ankle and foot: Secondary | ICD-10-CM | POA: Diagnosis not present

## 2023-10-23 DIAGNOSIS — M7741 Metatarsalgia, right foot: Secondary | ICD-10-CM

## 2023-10-23 DIAGNOSIS — Z981 Arthrodesis status: Secondary | ICD-10-CM | POA: Diagnosis not present

## 2023-10-31 DIAGNOSIS — M5416 Radiculopathy, lumbar region: Secondary | ICD-10-CM | POA: Diagnosis not present

## 2023-10-31 DIAGNOSIS — M79661 Pain in right lower leg: Secondary | ICD-10-CM | POA: Diagnosis not present

## 2023-10-31 DIAGNOSIS — M79662 Pain in left lower leg: Secondary | ICD-10-CM | POA: Diagnosis not present

## 2023-11-18 DIAGNOSIS — M79674 Pain in right toe(s): Secondary | ICD-10-CM | POA: Diagnosis not present

## 2023-11-28 DIAGNOSIS — Z23 Encounter for immunization: Secondary | ICD-10-CM | POA: Diagnosis not present

## 2023-12-31 DIAGNOSIS — Z23 Encounter for immunization: Secondary | ICD-10-CM | POA: Diagnosis not present

## 2024-02-07 DIAGNOSIS — M48062 Spinal stenosis, lumbar region with neurogenic claudication: Secondary | ICD-10-CM | POA: Diagnosis not present

## 2024-02-10 DIAGNOSIS — Z1231 Encounter for screening mammogram for malignant neoplasm of breast: Secondary | ICD-10-CM | POA: Diagnosis not present

## 2024-02-14 ENCOUNTER — Ambulatory Visit
Admission: RE | Admit: 2024-02-14 | Discharge: 2024-02-14 | Disposition: A | Discharge status: Home / Self Care | Source: Ambulatory Visit | Attending: Physician Assistant | Admitting: Physician Assistant

## 2024-02-14 ENCOUNTER — Other Ambulatory Visit: Payer: Self-pay | Admitting: Physician Assistant

## 2024-02-14 DIAGNOSIS — M50323 Other cervical disc degeneration at C6-C7 level: Secondary | ICD-10-CM | POA: Diagnosis not present

## 2024-02-14 DIAGNOSIS — M542 Cervicalgia: Secondary | ICD-10-CM

## 2024-02-14 DIAGNOSIS — R079 Chest pain, unspecified: Secondary | ICD-10-CM | POA: Diagnosis not present

## 2024-02-14 DIAGNOSIS — E559 Vitamin D deficiency, unspecified: Secondary | ICD-10-CM | POA: Diagnosis not present

## 2024-03-23 DIAGNOSIS — H43813 Vitreous degeneration, bilateral: Secondary | ICD-10-CM | POA: Diagnosis not present
